# Patient Record
Sex: Female | Born: 1995 | Hispanic: Yes | Smoking: Never smoker
Health system: Southern US, Community
[De-identification: ages and names within clinical notes are randomized; demographics above are authoritative.]

## PROBLEM LIST (undated history)

## (undated) DIAGNOSIS — R112 Nausea with vomiting, unspecified: Secondary | ICD-10-CM

## (undated) DIAGNOSIS — O139 Gestational [pregnancy-induced] hypertension without significant proteinuria, unspecified trimester: Secondary | ICD-10-CM

## (undated) DIAGNOSIS — Z9889 Other specified postprocedural states: Secondary | ICD-10-CM

## (undated) DIAGNOSIS — I10 Essential (primary) hypertension: Secondary | ICD-10-CM

## (undated) DIAGNOSIS — N12 Tubulo-interstitial nephritis, not specified as acute or chronic: Secondary | ICD-10-CM

## (undated) DIAGNOSIS — C801 Malignant (primary) neoplasm, unspecified: Secondary | ICD-10-CM

## (undated) DIAGNOSIS — N390 Urinary tract infection, site not specified: Secondary | ICD-10-CM

## (undated) HISTORY — PX: TUMOR REMOVAL: SHX12

---
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
## 2015-02-22 DIAGNOSIS — N12 Tubulo-interstitial nephritis, not specified as acute or chronic: Secondary | ICD-10-CM

## 2015-02-22 HISTORY — DX: Tubulo-interstitial nephritis, not specified as acute or chronic: N12

## 2015-03-09 ENCOUNTER — Encounter (HOSPITAL_COMMUNITY): Payer: Self-pay | Admitting: *Deleted

## 2015-03-09 ENCOUNTER — Emergency Department (HOSPITAL_COMMUNITY)
Admission: EM | Admit: 2015-03-09 | Discharge: 2015-03-09 | Disposition: A | Discharge status: Home / Self Care | Payer: Self-pay | Attending: Emergency Medicine | Admitting: Emergency Medicine

## 2015-03-09 ENCOUNTER — Emergency Department (HOSPITAL_COMMUNITY): Payer: Self-pay

## 2015-03-09 DIAGNOSIS — Z3202 Encounter for pregnancy test, result negative: Secondary | ICD-10-CM | POA: Insufficient documentation

## 2015-03-09 DIAGNOSIS — R05 Cough: Secondary | ICD-10-CM | POA: Insufficient documentation

## 2015-03-09 DIAGNOSIS — N390 Urinary tract infection, site not specified: Secondary | ICD-10-CM | POA: Insufficient documentation

## 2015-03-09 LAB — URINALYSIS, ROUTINE W REFLEX MICROSCOPIC
Bilirubin Urine: NEGATIVE
Glucose, UA: NEGATIVE mg/dL
Ketones, ur: 15 mg/dL — AB
Nitrite: POSITIVE — AB
Protein, ur: 30 mg/dL — AB
Specific Gravity, Urine: 1.016 (ref 1.005–1.030)
Urobilinogen, UA: 0.2 mg/dL (ref 0.0–1.0)
pH: 6.5 (ref 5.0–8.0)

## 2015-03-09 LAB — CBC
HCT: 33.9 % — ABNORMAL LOW (ref 36.0–46.0)
Hemoglobin: 10.8 g/dL — ABNORMAL LOW (ref 12.0–15.0)
MCH: 23.4 pg — ABNORMAL LOW (ref 26.0–34.0)
MCHC: 31.9 g/dL (ref 30.0–36.0)
MCV: 73.4 fL — ABNORMAL LOW (ref 78.0–100.0)
Platelets: 226 10*3/uL (ref 150–400)
RBC: 4.62 MIL/uL (ref 3.87–5.11)
RDW: 16.6 % — ABNORMAL HIGH (ref 11.5–15.5)
WBC: 15.5 10*3/uL — ABNORMAL HIGH (ref 4.0–10.5)

## 2015-03-09 LAB — I-STAT CG4 LACTIC ACID, ED: Lactic Acid, Venous: 2.49 mmol/L (ref 0.5–2.0)

## 2015-03-09 LAB — COMPREHENSIVE METABOLIC PANEL
ALT: 18 U/L (ref 14–54)
AST: 28 U/L (ref 15–41)
Albumin: 3.4 g/dL — ABNORMAL LOW (ref 3.5–5.0)
Alkaline Phosphatase: 53 U/L (ref 38–126)
Anion gap: 12 (ref 5–15)
BUN: 6 mg/dL (ref 6–20)
CO2: 21 mmol/L — ABNORMAL LOW (ref 22–32)
Calcium: 8.7 mg/dL — ABNORMAL LOW (ref 8.9–10.3)
Chloride: 99 mmol/L — ABNORMAL LOW (ref 101–111)
Creatinine, Ser: 0.85 mg/dL (ref 0.44–1.00)
GFR calc Af Amer: >60 mL/min (ref >60)
GFR calc non Af Amer: >60 mL/min (ref >60)
Glucose, Bld: 184 mg/dL — ABNORMAL HIGH (ref 65–99)
Potassium: 2.9 mmol/L — ABNORMAL LOW (ref 3.5–5.1)
Sodium: 132 mmol/L — ABNORMAL LOW (ref 135–145)
Total Bilirubin: 0.4 mg/dL (ref 0.3–1.2)
Total Protein: 7.3 g/dL (ref 6.5–8.1)

## 2015-03-09 LAB — URINE MICROSCOPIC-ADD ON

## 2015-03-09 LAB — I-STAT BETA HCG BLOOD, ED (MC, WL, AP ONLY): I-stat hCG, quantitative: <5 m[IU]/mL (ref <5)

## 2015-03-09 MED ORDER — POTASSIUM CHLORIDE CRYS ER 20 MEQ PO TBCR
40.0000 meq | EXTENDED_RELEASE_TABLET | Freq: Once | ORAL | Status: AC
  Administered 2015-03-09: 40 meq via ORAL
  Filled 2015-03-09: qty 2

## 2015-03-09 MED ORDER — ACETAMINOPHEN 325 MG PO TABS: 650.0000 mg | ORAL_TABLET | Freq: Once | ORAL | Status: DC | PRN

## 2015-03-09 MED ORDER — IBUPROFEN 400 MG PO TABS
800.0000 mg | ORAL_TABLET | Freq: Once | ORAL | Status: AC
  Administered 2015-03-09: 800 mg via ORAL

## 2015-03-09 MED ORDER — CEPHALEXIN 500 MG PO CAPS: 500.0000 mg | ORAL_CAPSULE | Freq: Four times a day (QID) | ORAL | Status: DC

## 2015-03-09 MED ORDER — DEXTROSE 5 % IV SOLN
1.0000 g | Freq: Once | INTRAVENOUS | Status: AC
  Administered 2015-03-09: 1 g via INTRAVENOUS
  Filled 2015-03-09: qty 10

## 2015-03-09 MED ORDER — PROMETHAZINE HCL 25 MG PO TABS: 25.0000 mg | ORAL_TABLET | Freq: Four times a day (QID) | ORAL | Status: DC | PRN

## 2015-03-09 MED ORDER — ACETAMINOPHEN 325 MG PO TABS
650.0000 mg | ORAL_TABLET | Freq: Once | ORAL | Status: AC
  Administered 2015-03-09: 650 mg via ORAL
  Filled 2015-03-09: qty 2

## 2015-03-09 MED ORDER — SODIUM CHLORIDE 0.9 % IV BOLUS (SEPSIS)
1000.0000 mL | Freq: Once | INTRAVENOUS | Status: AC
Start: 2015-03-09 — End: 2015-03-09
  Administered 2015-03-09: 1000 mL via INTRAVENOUS

## 2015-03-09 MED ORDER — SODIUM CHLORIDE 0.9 % IV BOLUS (SEPSIS)
1000.0000 mL | Freq: Once | INTRAVENOUS | Status: AC
  Administered 2015-03-09: 1000 mL via INTRAVENOUS

## 2015-03-09 MED ORDER — IBUPROFEN 400 MG PO TABS
ORAL_TABLET | ORAL | Status: AC
  Administered 2015-03-09: 13:00:00
  Filled 2015-03-09: qty 2

## 2015-03-09 NOTE — Discharge Instructions (Signed)
[facility]  Return sooner if problems

## 2015-03-09 NOTE — ED Provider Notes (Signed)
CSN: [id number]     Arrival date & time 03/09/15  1233 History   First MD Initiated Contact with Patient 03/09/15 1259     Chief Complaint  Patient presents with  . Cough  . Fever  . Emesis     (Consider location/radiation/quality/duration/timing/severity/associated sxs/prior Treatment) Patient is a 19 y.o. female presenting with cough, fever, and vomiting. The history is provided by the patient (Patient complains of a cough fevers and chills some vomiting for couple days).  Cough Cough characteristics:  Non-productive Severity:  Mild Onset quality:  Gradual Timing:  Intermittent Chronicity:  New Smoker: no   Context: not animal exposure   Associated symptoms: fever   Associated symptoms: no chest pain, no eye discharge, no headaches and no rash   Fever Associated symptoms: cough and vomiting   Associated symptoms: no chest pain, no congestion, no diarrhea, no headaches and no rash   Emesis Associated symptoms: no abdominal pain, no diarrhea and no headaches     History reviewed. No pertinent past medical history. History reviewed. No pertinent past surgical history. History reviewed. No pertinent family history. Social History  Substance Use Topics  . Smoking status: Never Smoker   . Smokeless tobacco: None  . Alcohol Use: No   OB History    No data available     Review of Systems  Constitutional: Positive for fever. Negative for appetite change and fatigue.  HENT: Negative for congestion, ear discharge and sinus pressure.   Eyes: Negative for discharge.  Respiratory: Positive for cough.   Cardiovascular: Negative for chest pain.  Gastrointestinal: Positive for vomiting. Negative for abdominal pain and diarrhea.  Genitourinary: Negative for frequency and hematuria.  Musculoskeletal: Negative for back pain.  Skin: Negative for rash.  Neurological: Negative for seizures and headaches.  Psychiatric/Behavioral: Negative for hallucinations.      Allergies   Review of patient's allergies indicates no known allergies.  Home Medications   Prior to Admission medications   Medication Sig Start Date End Date Taking? Authorizing Provider  acetaminophen (TYLENOL) 500 MG tablet Take 500 mg by mouth every 6 (six) hours as needed for moderate pain.   Yes Historical Provider, MD  cephALEXin (KEFLEX) 500 MG capsule Take 1 capsule (500 mg total) by mouth 4 (four) times daily. 03/09/15   Milton Ferguson, MD  promethazine (PHENERGAN) 25 MG tablet Take 1 tablet (25 mg total) by mouth every 6 (six) hours as needed for nausea or vomiting. 03/09/15   Milton Ferguson, MD   BP 116/60 mmHg  Pulse 91  Temp(Src) 99.2 F (37.3 C) (Oral)  Resp 20  Ht 5\' 3"  (1.6 m)  Wt 129 lb 12.8 oz (58.877 kg)  BMI 23.00 kg/m2  SpO2 100%  LMP 02/17/2015 Physical Exam  Constitutional: She is oriented to person, place, and time. She appears well-developed.  HENT:  Head: Normocephalic.  Eyes: Conjunctivae and EOM are normal. No scleral icterus.  Neck: Neck supple. No thyromegaly present.  Cardiovascular: Normal rate and regular rhythm.  Exam reveals no gallop and no friction rub.   No murmur heard. Pulmonary/Chest: No stridor. She has no wheezes. She has no rales. She exhibits no tenderness.  Abdominal: She exhibits no distension. There is no tenderness. There is no rebound.  Musculoskeletal: Normal range of motion. She exhibits no edema.  Lymphadenopathy:    She has no cervical adenopathy.  Neurological: She is oriented to person, place, and time. She exhibits normal muscle tone. Coordination normal.  Skin: No rash noted. No erythema.  Psychiatric: She has a normal mood and affect. Her behavior is normal.    ED Course  Procedures (including critical care time) Labs Review Labs Reviewed  COMPREHENSIVE METABOLIC PANEL - Abnormal; Notable for the following:    Sodium 132 (*)    Potassium 2.9 (*)    Chloride 99 (*)    CO2 21 (*)    Glucose, Bld 184 (*)    Calcium 8.7 (*)     Albumin 3.4 (*)    All other components within normal limits  URINALYSIS, ROUTINE W REFLEX MICROSCOPIC (NOT AT [facility]) - Abnormal; Notable for the following:    APPearance CLOUDY (*)    Hgb urine dipstick TRACE (*)    Ketones, ur 15 (*)    Protein, ur 30 (*)    Nitrite POSITIVE (*)    Leukocytes, UA MODERATE (*)    All other components within normal limits  CBC - Abnormal; Notable for the following:    WBC 15.5 (*)    Hemoglobin 10.8 (*)    HCT 33.9 (*)    MCV 73.4 (*)    MCH 23.4 (*)    RDW 16.6 (*)    All other components within normal limits  URINE MICROSCOPIC-ADD ON - Abnormal; Notable for the following:    Bacteria, UA MANY (*)    All other components within normal limits  I-STAT CG4 LACTIC ACID, ED - Abnormal; Notable for the following:    Lactic Acid, Venous 2.49 (*)    All other components within normal limits  URINE CULTURE  I-STAT BETA HCG BLOOD, ED ([facility], [facility], [facility] ONLY)  I-STAT CG4 LACTIC ACID, ED    Imaging Review Dg Chest 2 View  03/09/2015  CLINICAL DATA:  Productive cough and fever for 1 week. EXAM: CHEST  2 VIEW COMPARISON:  None. FINDINGS: The lungs are clear. Heart size is normal. No pneumothorax or pleural effusion. No focal bony abnormality. IMPRESSION: Negative chest. Electronically Signed   By: Inge Rise M.D.   On: 03/09/2015 14:08   I have personally reviewed and evaluated these images and lab results as part of my medical decision-making.   EKG Interpretation None      MDM   Final diagnoses:  UTI (lower urinary tract infection)      Patient's labs showed UTI and hypokalemia. Patient was hydrated with a liter of fluid here in emergency department and given a gram Rocephin IV. She appeared nontoxic discharge and will be given prescription for Keflex and Zofran and referred to [facility]  Milton Ferguson, MD 03/09/15 1559

## 2015-03-09 NOTE — ED Notes (Signed)
Pt reports recent cough and cold symptoms, having productive cough with green sputum and reports fever x 1 week. Today began vomiting and feeling sob. HR 144 at triage.

## 2015-03-10 ENCOUNTER — Inpatient Hospital Stay (HOSPITAL_COMMUNITY)
Admission: EM | Admit: 2015-03-10 | Discharge: 2015-03-12 | DRG: 872 | Disposition: A | Discharge status: Home / Self Care | Payer: Self-pay | Attending: Internal Medicine | Admitting: Internal Medicine

## 2015-03-10 ENCOUNTER — Encounter (HOSPITAL_COMMUNITY): Payer: Self-pay | Admitting: Emergency Medicine

## 2015-03-10 ENCOUNTER — Emergency Department (HOSPITAL_COMMUNITY): Payer: Self-pay

## 2015-03-10 DIAGNOSIS — N12 Tubulo-interstitial nephritis, not specified as acute or chronic: Secondary | ICD-10-CM | POA: Diagnosis present

## 2015-03-10 DIAGNOSIS — N390 Urinary tract infection, site not specified: Secondary | ICD-10-CM

## 2015-03-10 DIAGNOSIS — A4151 Sepsis due to Escherichia coli [E. coli]: Principal | ICD-10-CM | POA: Diagnosis present

## 2015-03-10 DIAGNOSIS — A419 Sepsis, unspecified organism: Secondary | ICD-10-CM | POA: Diagnosis present

## 2015-03-10 DIAGNOSIS — B9620 Unspecified Escherichia coli [E. coli] as the cause of diseases classified elsewhere: Secondary | ICD-10-CM | POA: Diagnosis present

## 2015-03-10 HISTORY — DX: Tubulo-interstitial nephritis, not specified as acute or chronic: N12

## 2015-03-10 HISTORY — DX: Urinary tract infection, site not specified: N39.0

## 2015-03-10 LAB — CBC WITH DIFFERENTIAL/PLATELET
Basophils Absolute: 0 10*3/uL (ref 0.0–0.1)
Basophils Relative: 0 %
Eosinophils Absolute: 0 10*3/uL (ref 0.0–0.7)
Eosinophils Relative: 0 %
HCT: 33.2 % — ABNORMAL LOW (ref 36.0–46.0)
Hemoglobin: 10.7 g/dL — ABNORMAL LOW (ref 12.0–15.0)
Lymphocytes Relative: 10 %
Lymphs Abs: 1.7 10*3/uL (ref 0.7–4.0)
MCH: 23.6 pg — ABNORMAL LOW (ref 26.0–34.0)
MCHC: 32.2 g/dL (ref 30.0–36.0)
MCV: 73.3 fL — ABNORMAL LOW (ref 78.0–100.0)
Monocytes Absolute: 1.9 10*3/uL — ABNORMAL HIGH (ref 0.1–1.0)
Monocytes Relative: 10 %
Neutro Abs: 14.5 10*3/uL — ABNORMAL HIGH (ref 1.7–7.7)
Neutrophils Relative %: 80 %
Platelets: 244 10*3/uL (ref 150–400)
RBC: 4.53 MIL/uL (ref 3.87–5.11)
RDW: 16.6 % — ABNORMAL HIGH (ref 11.5–15.5)
WBC: 18.2 10*3/uL — ABNORMAL HIGH (ref 4.0–10.5)

## 2015-03-10 LAB — I-STAT CG4 LACTIC ACID, ED: Lactic Acid, Venous: 2.39 mmol/L (ref 0.5–2.0)

## 2015-03-10 MED ORDER — ACETAMINOPHEN 325 MG PO TABS
ORAL_TABLET | ORAL | Status: AC
  Filled 2015-03-10: qty 2

## 2015-03-10 MED ORDER — ACETAMINOPHEN 325 MG PO TABS
650.0000 mg | ORAL_TABLET | Freq: Once | ORAL | Status: AC | PRN
  Administered 2015-03-10: 650 mg via ORAL

## 2015-03-10 NOTE — ED Notes (Signed)
Pt. reports left flank pain with fever and chills onset last week , sen here yesterday diagnosed with UTI .

## 2015-03-11 ENCOUNTER — Emergency Department (HOSPITAL_COMMUNITY): Payer: Self-pay

## 2015-03-11 ENCOUNTER — Encounter (HOSPITAL_COMMUNITY): Payer: Self-pay | Admitting: General Practice

## 2015-03-11 DIAGNOSIS — B9620 Unspecified Escherichia coli [E. coli] as the cause of diseases classified elsewhere: Secondary | ICD-10-CM

## 2015-03-11 DIAGNOSIS — A419 Sepsis, unspecified organism: Secondary | ICD-10-CM

## 2015-03-11 DIAGNOSIS — N390 Urinary tract infection, site not specified: Secondary | ICD-10-CM

## 2015-03-11 DIAGNOSIS — N12 Tubulo-interstitial nephritis, not specified as acute or chronic: Secondary | ICD-10-CM

## 2015-03-11 LAB — COMPREHENSIVE METABOLIC PANEL WITH GFR
ALT: 20 U/L (ref 14–54)
AST: 29 U/L (ref 15–41)
Albumin: 3.1 g/dL — ABNORMAL LOW (ref 3.5–5.0)
Alkaline Phosphatase: 60 U/L (ref 38–126)
Anion gap: 10 (ref 5–15)
BUN: <5 mg/dL — ABNORMAL LOW (ref 6–20)
CO2: 21 mmol/L — ABNORMAL LOW (ref 22–32)
Calcium: 8.4 mg/dL — ABNORMAL LOW (ref 8.9–10.3)
Chloride: 100 mmol/L — ABNORMAL LOW (ref 101–111)
Creatinine, Ser: 0.78 mg/dL (ref 0.44–1.00)
GFR calc Af Amer: >60 mL/min
GFR calc non Af Amer: >60 mL/min
Glucose, Bld: 137 mg/dL — ABNORMAL HIGH (ref 65–99)
Potassium: 3 mmol/L — ABNORMAL LOW (ref 3.5–5.1)
Sodium: 131 mmol/L — ABNORMAL LOW (ref 135–145)
Total Bilirubin: 0.4 mg/dL (ref 0.3–1.2)
Total Protein: 7.5 g/dL (ref 6.5–8.1)

## 2015-03-11 LAB — URINE CULTURE
Culture: >=100000
Special Requests: NORMAL

## 2015-03-11 LAB — URINALYSIS, ROUTINE W REFLEX MICROSCOPIC
Bilirubin Urine: NEGATIVE
Glucose, UA: NEGATIVE mg/dL
Hgb urine dipstick: NEGATIVE
Ketones, ur: NEGATIVE mg/dL
Leukocytes, UA: NEGATIVE
Nitrite: NEGATIVE
Protein, ur: 30 mg/dL — AB
Specific Gravity, Urine: 1.015 (ref 1.005–1.030)
Urobilinogen, UA: 1 mg/dL (ref 0.0–1.0)
pH: 6 (ref 5.0–8.0)

## 2015-03-11 LAB — POC URINE PREG, ED: Preg Test, Ur: NEGATIVE

## 2015-03-11 LAB — URINE MICROSCOPIC-ADD ON

## 2015-03-11 LAB — I-STAT CG4 LACTIC ACID, ED: Lactic Acid, Venous: 0.96 mmol/L (ref 0.5–2.0)

## 2015-03-11 MED ORDER — HYDROCODONE-ACETAMINOPHEN 5-325 MG PO TABS
1.0000 | ORAL_TABLET | ORAL | Status: DC | PRN
  Administered 2015-03-11 – 2015-03-12 (×4): 1 via ORAL
  Filled 2015-03-11 (×4): qty 1

## 2015-03-11 MED ORDER — IOHEXOL 300 MG/ML  SOLN
100.0000 mL | Freq: Once | INTRAMUSCULAR | Status: AC | PRN
  Administered 2015-03-11: 100 mL via INTRAVENOUS

## 2015-03-11 MED ORDER — ACETAMINOPHEN 325 MG PO TABS: 650.0000 mg | ORAL_TABLET | Freq: Four times a day (QID) | ORAL | Status: DC | PRN

## 2015-03-11 MED ORDER — CEFTRIAXONE SODIUM 2 G IJ SOLR
2.0000 g | INTRAMUSCULAR | Status: DC
  Administered 2015-03-12: 2 g via INTRAVENOUS
  Filled 2015-03-11 (×2): qty 2

## 2015-03-11 MED ORDER — INFLUENZA VAC SPLIT QUAD 0.5 ML IM SUSY
0.5000 mL | PREFILLED_SYRINGE | INTRAMUSCULAR | Status: AC
Start: 2015-03-12 — End: 2015-03-12
  Administered 2015-03-12: 0.5 mL via INTRAMUSCULAR
  Filled 2015-03-11: qty 0.5

## 2015-03-11 MED ORDER — MORPHINE SULFATE (PF) 4 MG/ML IV SOLN
4.0000 mg | Freq: Once | INTRAVENOUS | Status: AC
  Administered 2015-03-11: 4 mg via INTRAVENOUS
  Filled 2015-03-11: qty 1

## 2015-03-11 MED ORDER — DEXTROSE 5 % IV SOLN
2.0000 g | Freq: Once | INTRAVENOUS | Status: AC
  Administered 2015-03-11: 2 g via INTRAVENOUS
  Filled 2015-03-11: qty 2

## 2015-03-11 MED ORDER — SODIUM CHLORIDE 0.9 % IV BOLUS (SEPSIS)
2000.0000 mL | Freq: Once | INTRAVENOUS | Status: AC
  Administered 2015-03-11: 2000 mL via INTRAVENOUS

## 2015-03-11 MED ORDER — ONDANSETRON HCL 4 MG/2ML IJ SOLN
4.0000 mg | Freq: Once | INTRAMUSCULAR | Status: AC
  Administered 2015-03-11: 4 mg via INTRAVENOUS
  Filled 2015-03-11: qty 2

## 2015-03-11 MED ORDER — ENOXAPARIN SODIUM 40 MG/0.4ML ~~LOC~~ SOLN
40.0000 mg | Freq: Every day | SUBCUTANEOUS | Status: DC
  Administered 2015-03-11: 40 mg via SUBCUTANEOUS
  Filled 2015-03-11 (×3): qty 0.4

## 2015-03-11 MED ORDER — SODIUM CHLORIDE 0.9 % IV SOLN
INTRAVENOUS | Status: DC
  Administered 2015-03-11 – 2015-03-12 (×3): via INTRAVENOUS

## 2015-03-11 MED ORDER — DEXTROSE 5 % IV SOLN: 1.0000 g | INTRAVENOUS | Status: DC

## 2015-03-11 NOTE — ED Provider Notes (Signed)
CSN: [id number]     Arrival date & time 03/10/15  2246 History   First MD Initiated Contact with Patient 03/10/15 2344     Chief Complaint  Patient presents with  . Flank Pain  . Fever     (Consider location/radiation/quality/duration/timing/severity/associated sxs/prior Treatment) HPI  This is a 19 year old female who presents with persistent left flank pain and fever. Patient was seen and evaluated yesterday. She was found to have a urinary tract infection. She was given IV Rocephin and discharged home with antibiotics. She reports that she has been taking her antibiotics. She reports persistent left flank pain and continued fevers.  Currently her pain is 10 out of 10. She's not taken anything for the pain. Fevers to 103 at home.  Patient also reports nausea and vomiting. Denies any abdominal pain.  Urine culture from yesterday growing out greater than 100,000 Escherichia coli.  Past Medical History  Diagnosis Date  . UTI (lower urinary tract infection)    History reviewed. No pertinent past surgical history. No family history on file. Social History  Substance Use Topics  . Smoking status: Never Smoker   . Smokeless tobacco: None  . Alcohol Use: No   OB History    No data available     Review of Systems  Constitutional: Positive for fever.  Respiratory: Negative for chest tightness and shortness of breath.   Cardiovascular: Negative for chest pain.  Gastrointestinal: Positive for nausea and vomiting. Negative for abdominal pain.  Genitourinary: Positive for dysuria and flank pain.  Musculoskeletal: Negative for back pain.  Skin: Negative for wound.  Neurological: Negative for headaches.  Psychiatric/Behavioral: Negative for confusion.  All other systems reviewed and are negative.     Allergies  Review of patient's allergies indicates no known allergies.  Home Medications   Prior to Admission medications   Medication Sig Start Date End Date Taking? Authorizing  Provider  acetaminophen (TYLENOL) 500 MG tablet Take 500 mg by mouth every 6 (six) hours as needed for moderate pain.   Yes Historical Provider, MD  cephALEXin (KEFLEX) 500 MG capsule Take 1 capsule (500 mg total) by mouth 4 (four) times daily. 03/09/15  Yes Milton Ferguson, MD  promethazine (PHENERGAN) 25 MG tablet Take 1 tablet (25 mg total) by mouth every 6 (six) hours as needed for nausea or vomiting. 03/09/15  Yes Milton Ferguson, MD   BP 109/65 mmHg  Pulse 80  Temp(Src) 103.2 F (39.6 C) (Oral)  Resp 16  Ht 5\' 3"  (1.6 m)  Wt 130 lb (58.968 kg)  BMI 23.03 kg/m2  SpO2 99%  LMP 02/17/2015 Physical Exam  Constitutional: She is oriented to person, place, and time. She appears well-developed and well-nourished. No distress.  HENT:  Head: Normocephalic and atraumatic.  Cardiovascular: Regular rhythm and normal heart sounds.   Tachycardia  Pulmonary/Chest: Effort normal and breath sounds normal. No respiratory distress. She has no wheezes.  Abdominal: Soft. Bowel sounds are normal. There is no tenderness. There is no rebound.  Genitourinary:  Left CVA tenderness  Neurological: She is alert and oriented to person, place, and time.  Skin: Skin is warm and dry.  Psychiatric: She has a normal mood and affect.  Nursing note and vitals reviewed.   ED Course  Procedures (including critical care time) Labs Review Labs Reviewed  COMPREHENSIVE METABOLIC PANEL - Abnormal; Notable for the following:    Sodium 131 (*)    Potassium 3.0 (*)    Chloride 100 (*)    CO2 21 (*)  Glucose, Bld 137 (*)    BUN <5 (*)    Calcium 8.4 (*)    Albumin 3.1 (*)    All other components within normal limits  URINALYSIS, ROUTINE W REFLEX MICROSCOPIC (NOT AT [facility]) - Abnormal; Notable for the following:    Protein, ur 30 (*)    All other components within normal limits  CBC WITH DIFFERENTIAL/PLATELET - Abnormal; Notable for the following:    WBC 18.2 (*)    Hemoglobin 10.7 (*)    HCT 33.2 (*)    MCV  73.3 (*)    MCH 23.6 (*)    RDW 16.6 (*)    Neutro Abs 14.5 (*)    Monocytes Absolute 1.9 (*)    All other components within normal limits  URINE MICROSCOPIC-ADD ON - Abnormal; Notable for the following:    Squamous Epithelial / LPF FEW (*)    Bacteria, UA FEW (*)    All other components within normal limits  I-STAT CG4 LACTIC ACID, ED - Abnormal; Notable for the following:    Lactic Acid, Venous 2.39 (*)    All other components within normal limits  CULTURE, BLOOD (ROUTINE X 2)  CULTURE, BLOOD (ROUTINE X 2)  URINE CULTURE  POC URINE PREG, ED  I-STAT CG4 LACTIC ACID, ED    Imaging Review Dg Chest 2 View  03/10/2015  CLINICAL DATA:  Cough and fever for 1 week EXAM: CHEST - 2 VIEW COMPARISON:  03/09/1959 FINDINGS: The heart size and mediastinal contours are within normal limits. Both lungs are clear. The visualized skeletal structures are unremarkable. IMPRESSION: No active disease. Electronically Signed   By: Inez Catalina M.D.   On: 03/10/2015 23:38   Dg Chest 2 View  03/09/2015  CLINICAL DATA:  Productive cough and fever for 1 week. EXAM: CHEST  2 VIEW COMPARISON:  None. FINDINGS: The lungs are clear. Heart size is normal. No pneumothorax or pleural effusion. No focal bony abnormality. IMPRESSION: Negative chest. Electronically Signed   By: Inge Rise M.D.   On: 03/09/2015 14:08   Ct Abdomen Pelvis W Contrast  03/11/2015  CLINICAL DATA:  Acute onset of left flank pain, with fever and chills. Initial encounter. EXAM: CT ABDOMEN AND PELVIS WITH CONTRAST TECHNIQUE: Multidetector CT imaging of the abdomen and pelvis was performed using the standard protocol following bolus administration of intravenous contrast. CONTRAST:  143mL OMNIPAQUE IOHEXOL 300 MG/ML  SOLN COMPARISON:  CT of the abdomen and pelvis from 10/06/2010 FINDINGS: Minimal bibasilar atelectasis is noted. The liver and spleen are unremarkable in appearance. The gallbladder is within normal limits. The pancreas and  adrenal glands are unremarkable. Multiple areas of decreased enhancement are noted in the periphery of both kidneys, particularly on the left, with mild overlying soft tissue stranding, compatible with bilateral pyelonephritis. There is no evidence of hydronephrosis. No renal or ureteral stones are seen. No free fluid is identified. The small bowel is unremarkable in appearance. The stomach is within normal limits. No acute vascular abnormalities are seen. The appendix is normal in caliber and contains air, without evidence of appendicitis. The colon is unremarkable in appearance. The bladder is mildly distended and grossly unremarkable. The uterus is within normal limits. The right ovary is unremarkable in appearance. The left ovary is not well characterized. No suspicious adnexal masses are seen. No inguinal lymphadenopathy is seen. No acute osseous abnormalities are identified. IMPRESSION: Multiple areas of decreased enhancement in the periphery of both kidneys, particularly on the left, with mild overlying soft  tissue stranding, compatible with bilateral pyelonephritis. Electronically Signed   By: Garald Balding M.D.   On: 03/11/2015 01:55   I have personally reviewed and evaluated these images and lab results as part of my medical decision-making.   EKG Interpretation None      MDM   Final diagnoses:  Pyelonephritis    Patient presents with persistent left flank pain and fever. Was diagnosed with urinary tract infection. Discharged with antibiotics. Continues to have pain and fever. Tachycardic and febrile on initial evaluation. CVA tenderness on the left. Patient given fluids and Zofran. Patient given 2 g of IV Rocephin. She was also given morphine. CT obtained to rule out perinephric abscess. CT negative for perinephric abscess; however, patient does have some evidence of bilateral pyelonephritis. On repeat evaluation, patient reporting persistent pain. Vital signs are stabilized. Discussed  with patient admission for further pain management and hydration as well as antibiotics. Feel this is reasonable given that this is the patient's visit and she remains febrile with evidence of possible bilateral pyelonephritis.      Merryl Hacker, MD 03/11/15 [phone]

## 2015-03-11 NOTE — Progress Notes (Signed)
Shannon Lyons [id number] Admission Data: 03/11/2015 10:43 AM Attending Provider: Mendel Corning, MD  PCP:No primary care provider on file. Consults/ Treatment Team:    Shannon Lyons is a 19 y.o. female patient admitted from ED awake, alert  & orientated  X 3,  Full Code, VSS - Blood pressure 117/68, pulse 101, temperature 102.3 F (39.1 C), temperature source Oral, resp. rate 16, height 5\' 3"  (1.6 m), weight 58.968 kg (130 lb), last menstrual period 02/17/2015, SpO2 100 %., RA, no c/o shortness of breath, no c/o chest pain, no distress noted.    IV site WDL:  hand left, condition patent and no redness with a transparent dsg that's clean dry and intact.  Allergies:  No Known Allergies   Past Medical History  Diagnosis Date  . UTI (lower urinary tract infection)     History:  obtained from chart review. Tobacco/alcohol: denied none  Pt orientation to unit, room and routine. Information packet given to patient/family and safety video watched.  Admission INP armband ID verified with patient/family, and in place. SR up x 2, fall risk assessment complete with Patient and family verbalizing understanding of risks associated with falls. Pt verbalizes an understanding of how to use the call bell and to call for help before getting out of bed.  Skin, clean-dry- intact without evidence of bruising, or skin tears.   No evidence of skin break down noted on exam, color normal. Assessed for pain and was given pain medication for pain and fever    Will cont to monitor and assist as needed.  [name], RN 03/11/2015 10:43 AM

## 2015-03-11 NOTE — Progress Notes (Signed)
[facility]                                                                              Patient Demographics  Shannon Lyons, is a 19 y.o. female, DOB - [birth year], GMW:[id number]  Admit date - 03/10/2015   Admitting Physician Etta Quill, DO  Outpatient Primary MD for the patient is No primary care provider on file.  LOS - 0   Chief Complaint  Patient presents with  . Flank Pain  . Fever       Brief HPI   Shannon Lyons is a 19 y.o. female presents to the ED with persistent L flank pain and fever. Patient was seen in ED yesterday, found to have UTI, given IV rocephin and discharged home with ABx. Despite taking ABx at home her symptoms have persisted and she returns to the ED today with 10/10 flank pain. Fevers 103 at home (103.2 in ED). Also has N/V.   Assessment & Plan    Principal Problem:   Pyelonephritis secondary to Escherichia coli UTI - CT abdomen showed multiple areas of decreased enhancement in the periphery of both kidneys particularly on the left compatible with bilateral pyelonephritis - Follow blood cultures, urine culture and sensitivities. Urine culture from 10/16 showed Escherichia coli - Continue IV Rocephin - Once pain is controlled and blood cultures remain negative, will DC home on oral antibiotics for 2 weeks  Active Problems:   Sepsis secondary to UTI ([place]) - Follow blood cultures   Code Status: Full CODE STATUS  Family Communication: Discussed in detail with the patient, all imaging results, lab results explained to the patient and family member in the room   Disposition Plan:   Time Spent in minutes  20 minutes  Procedures  CT abd  Consults   None   DVT Prophylaxis   Lovenox   Medications  Scheduled Meds: . cefTRIAXone (ROCEPHIN)  IV  2 g Intravenous Q24H  . enoxaparin (LOVENOX) injection  40 mg Subcutaneous Daily  . [START ON 03/12/2015] Influenza vac split quadrivalent PF  0.5 mL Intramuscular  Tomorrow-1000   Continuous Infusions: . sodium chloride Stopped (03/11/15 0822)   PRN Meds:.acetaminophen, HYDROcodone-acetaminophen   Antibiotics   Anti-infectives    Start     Dose/Rate Route Frequency Ordered Stop   03/12/15 0015  cefTRIAXone (ROCEPHIN) 1 g in dextrose 5 % 50 mL IVPB  Status:  Discontinued     1 g 100 mL/hr over 30 Minutes Intravenous Every 24 hours 03/11/15 0346 03/11/15 1341   03/11/15 1345  cefTRIAXone (ROCEPHIN) 2 g in dextrose 5 % 50 mL IVPB     2 g 100 mL/hr over 30 Minutes Intravenous Every 24 hours 03/11/15 1341     03/11/15 0015  cefTRIAXone (ROCEPHIN) 2 g in dextrose 5 % 50 mL IVPB     2 g 100 mL/hr over 30 Minutes Intravenous  Once 03/11/15 0012 03/11/15 0104        Subjective:   Shannon Lyons was seen and examined today.  Complaining of flank pains, worse in the left. Patient denies dizziness, chest pain, shortness of breath, new weakness, numbess, tingling.  No acute events overnight.    Objective:   Blood pressure 117/68, pulse 101, temperature 99.3 F (37.4 C), temperature source Oral, resp. rate 16, height 5\' 3"  (1.6 m), weight 58.968 kg (130 lb), last menstrual period 02/17/2015, SpO2 100 %.  Wt Readings from Last 3 Encounters:  03/10/15 58.968 kg (130 lb)  03/09/15 58.877 kg (129 lb 12.8 oz)    No intake or output data in the 24 hours ending 03/11/15 1354  Exam  General: Alert and oriented x 3, NAD  HEENT:  PERRLA, EOMI, Anicteric Sclera, mucous membranes moist.   Neck: Supple, no JVD, no masses  CVS: S1 S2 auscultated, no rubs, murmurs or gallops. Regular rate and rhythm.  Respiratory: Clear to auscultation bilaterally, no wheezing, rales or rhonchi  Abdomen: Soft, nontender, nondistended, + bowel sounds. + CVAT left  Ext: no cyanosis clubbing or edema  Neuro: AAOx3, Cr N's II- XII. Strength 5/5 upper and lower extremities bilaterally  Skin: No rashes  Psych: Normal affect and demeanor, alert and oriented x3     Data Review   Micro Results Recent Results (from the past 240 hour(s))  Urine culture     Status: None   Collection Time: 03/09/15  1:39 PM  Result Value Ref Range Status   Specimen Description URINE, RANDOM  Final   Special Requests Normal  Final   Culture >=100,000 COLONIES/mL ESCHERICHIA COLI  Final   Report Status 03/11/2015 FINAL  Final   Organism ID, Bacteria ESCHERICHIA COLI  Final      Susceptibility   Escherichia coli - MIC*    AMPICILLIN >=32 RESISTANT Resistant     CEFAZOLIN <=4 SENSITIVE Sensitive     CEFTRIAXONE <=1 SENSITIVE Sensitive     CIPROFLOXACIN <=0.25 SENSITIVE Sensitive     GENTAMICIN <=1 SENSITIVE Sensitive     IMIPENEM <=0.25 SENSITIVE Sensitive     NITROFURANTOIN <=16 SENSITIVE Sensitive     TRIMETH/SULFA >=320 RESISTANT Resistant     AMPICILLIN/SULBACTAM 16 INTERMEDIATE Intermediate     PIP/TAZO <=4 SENSITIVE Sensitive     * >=100,000 COLONIES/mL ESCHERICHIA COLI    Radiology Reports Dg Chest 2 View  03/10/2015  CLINICAL DATA:  Cough and fever for 1 week EXAM: CHEST - 2 VIEW COMPARISON:  03/09/1959 FINDINGS: The heart size and mediastinal contours are within normal limits. Both lungs are clear. The visualized skeletal structures are unremarkable. IMPRESSION: No active disease. Electronically Signed   By: Inez Catalina M.D.   On: 03/10/2015 23:38   Dg Chest 2 View  03/09/2015  CLINICAL DATA:  Productive cough and fever for 1 week. EXAM: CHEST  2 VIEW COMPARISON:  None. FINDINGS: The lungs are clear. Heart size is normal. No pneumothorax or pleural effusion. No focal bony abnormality. IMPRESSION: Negative chest. Electronically Signed   By: Inge Rise M.D.   On: 03/09/2015 14:08   Ct Abdomen Pelvis W Contrast  03/11/2015  CLINICAL DATA:  Acute onset of left flank pain, with fever and chills. Initial encounter. EXAM: CT ABDOMEN AND PELVIS WITH CONTRAST TECHNIQUE: Multidetector CT imaging of the abdomen and pelvis was performed using the standard  protocol following bolus administration of intravenous contrast. CONTRAST:  145mL OMNIPAQUE IOHEXOL 300 MG/ML  SOLN COMPARISON:  CT of the abdomen and pelvis from 10/06/2010 FINDINGS: Minimal bibasilar atelectasis is noted. The liver and spleen are unremarkable in appearance. The gallbladder is within normal limits. The pancreas and adrenal glands are unremarkable. Multiple areas of decreased enhancement are noted in the periphery of  both kidneys, particularly on the left, with mild overlying soft tissue stranding, compatible with bilateral pyelonephritis. There is no evidence of hydronephrosis. No renal or ureteral stones are seen. No free fluid is identified. The small bowel is unremarkable in appearance. The stomach is within normal limits. No acute vascular abnormalities are seen. The appendix is normal in caliber and contains air, without evidence of appendicitis. The colon is unremarkable in appearance. The bladder is mildly distended and grossly unremarkable. The uterus is within normal limits. The right ovary is unremarkable in appearance. The left ovary is not well characterized. No suspicious adnexal masses are seen. No inguinal lymphadenopathy is seen. No acute osseous abnormalities are identified. IMPRESSION: Multiple areas of decreased enhancement in the periphery of both kidneys, particularly on the left, with mild overlying soft tissue stranding, compatible with bilateral pyelonephritis. Electronically Signed   By: Garald Balding M.D.   On: 03/11/2015 01:55    CBC  Recent Labs Lab 03/09/15 1252 03/10/15 2339  WBC 15.5* 18.2*  HGB 10.8* 10.7*  HCT 33.9* 33.2*  PLT 226 244  MCV 73.4* 73.3*  MCH 23.4* 23.6*  MCHC 31.9 32.2  RDW 16.6* 16.6*  LYMPHSABS  --  1.7  MONOABS  --  1.9*  EOSABS  --  0.0  BASOSABS  --  0.0    Chemistries   Recent Labs Lab 03/09/15 1252 03/10/15 2339  NA 132* 131*  K 2.9* 3.0*  CL 99* 100*  CO2 21* 21*  GLUCOSE 184* 137*  BUN 6 <5*  CREATININE  0.85 0.78  CALCIUM 8.7* 8.4*  AST 28 29  ALT 18 20  ALKPHOS 53 60  BILITOT 0.4 0.4   ------------------------------------------------------------------------------------------------------------------ estimated creatinine clearance is 92.8 mL/min (by C-G formula based on Cr of 0.78). ------------------------------------------------------------------------------------------------------------------ No results for input(s): HGBA1C in the last 72 hours. ------------------------------------------------------------------------------------------------------------------ No results for input(s): CHOL, HDL, LDLCALC, TRIG, CHOLHDL, LDLDIRECT in the last 72 hours. ------------------------------------------------------------------------------------------------------------------ No results for input(s): TSH, T4TOTAL, T3FREE, THYROIDAB in the last 72 hours.  Invalid input(s): FREET3 ------------------------------------------------------------------------------------------------------------------ No results for input(s): VITAMINB12, FOLATE, FERRITIN, TIBC, IRON, RETICCTPCT in the last 72 hours.  Coagulation profile No results for input(s): INR, PROTIME in the last 168 hours.  No results for input(s): DDIMER in the last 72 hours.  Cardiac Enzymes No results for input(s): CKMB, TROPONINI, MYOGLOBIN in the last 168 hours.  Invalid input(s): CK ------------------------------------------------------------------------------------------------------------------ Invalid input(s): POCBNP  No results for input(s): GLUCAP in the last 72 hours.   Starling Jessie M.D. [facility] 03/11/2015, 1:54 PM  Pager: [phone] Between 7am to 7pm - call Pager - [phone]  After 7pm go to [email or web address] - password [email or web address]  Call night coverage person covering after 7pm

## 2015-03-11 NOTE — H&P (Signed)
[facility] History and Physical  Shannon Lyons WIO:[id number] DOB: 1996-04-21 DOA: 03/10/2015  Referring physician: EDP PCP: No primary care provider on file.   Chief Complaint: Flank pain, fever   HPI: Shannon Lyons is a 19 y.o. female presents to the ED with persistent L flank pain and fever.  Patient was seen in ED yesterday, found to have UTI, given IV rocephin and discharged home with ABx.  Despite taking ABx at home her symptoms have persisted and she returns to the ED today with 10/10 flank pain.  Fevers 103 at home (103.2 in ED).  Also has N/V.  Review of Systems: Systems reviewed.  As above, otherwise negative  Past Medical History  Diagnosis Date  . UTI (lower urinary tract infection)    History reviewed. No pertinent past surgical history. Social History:  reports that she has never smoked. She does not have any smokeless tobacco history on file. She reports that she does not drink alcohol or use illicit drugs.  No Known Allergies  No family history on file.   Prior to Admission medications   Medication Sig Start Date End Date Taking? Authorizing Provider  acetaminophen (TYLENOL) 500 MG tablet Take 500 mg by mouth every 6 (six) hours as needed for moderate pain.   Yes Historical Provider, MD  cephALEXin (KEFLEX) 500 MG capsule Take 1 capsule (500 mg total) by mouth 4 (four) times daily. 03/09/15  Yes Milton Ferguson, MD  promethazine (PHENERGAN) 25 MG tablet Take 1 tablet (25 mg total) by mouth every 6 (six) hours as needed for nausea or vomiting. 03/09/15  Yes Milton Ferguson, MD   Physical Exam: Filed Vitals:   03/11/15 0215  BP: 109/65  Pulse: 80  Temp:   Resp: 16    BP 109/65 mmHg  Pulse 80  Temp(Src) 103.2 F (39.6 C) (Oral)  Resp 16  Ht 5\' 3"  (1.6 m)  Wt 58.968 kg (130 lb)  BMI 23.03 kg/m2  SpO2 99%  LMP 02/17/2015  General Appearance:    Alert, oriented, no distress, appears stated age  Head:    Normocephalic, atraumatic  Eyes:     PERRL, EOMI, sclera non-icteric        Nose:   Nares without drainage or epistaxis. Mucosa, turbinates normal  Throat:   Moist mucous membranes. Oropharynx without erythema or exudate.  Neck:   Supple. No carotid bruits.  No thyromegaly.  No lymphadenopathy.   Back:     No CVA tenderness, no spinal tenderness  Lungs:     Clear to auscultation bilaterally, without wheezes, rhonchi or rales  Chest wall:    No tenderness to palpitation  Heart:    Regular rate and rhythm without murmurs, gallops, rubs  Abdomen:     Soft, non-tender, nondistended, normal bowel sounds, no organomegaly  Genitalia:    deferred  Rectal:    deferred  Extremities:   No clubbing, cyanosis or edema.  Pulses:   2+ and symmetric all extremities  Skin:   Skin color, texture, turgor normal, no rashes or lesions  Lymph nodes:   Cervical, supraclavicular, and axillary nodes normal  Neurologic:   CNII-XII intact. Normal strength, sensation and reflexes      throughout    Labs on Admission:  Basic Metabolic Panel:  Recent Labs Lab 03/09/15 1252 03/10/15 2339  NA 132* 131*  K 2.9* 3.0*  CL 99* 100*  CO2 21* 21*  GLUCOSE 184* 137*  BUN 6 <5*  CREATININE 0.85 0.78  CALCIUM 8.7* 8.4*  Liver Function Tests:  Recent Labs Lab 03/09/15 1252 03/10/15 2339  AST 28 29  ALT 18 20  ALKPHOS 53 60  BILITOT 0.4 0.4  PROT 7.3 7.5  ALBUMIN 3.4* 3.1*   No results for input(s): LIPASE, AMYLASE in the last 168 hours. No results for input(s): AMMONIA in the last 168 hours. CBC:  Recent Labs Lab 03/09/15 1252 03/10/15 2339  WBC 15.5* 18.2*  NEUTROABS  --  14.5*  HGB 10.8* 10.7*  HCT 33.9* 33.2*  MCV 73.4* 73.3*  PLT 226 244   Cardiac Enzymes: No results for input(s): CKTOTAL, CKMB, CKMBINDEX, TROPONINI in the last 168 hours.  BNP (last 3 results) No results for input(s): PROBNP in the last 8760 hours. CBG: No results for input(s): GLUCAP in the last 168 hours.  Radiological Exams on Admission: Dg Chest  2 View  03/10/2015  CLINICAL DATA:  Cough and fever for 1 week EXAM: CHEST - 2 VIEW COMPARISON:  03/09/1959 FINDINGS: The heart size and mediastinal contours are within normal limits. Both lungs are clear. The visualized skeletal structures are unremarkable. IMPRESSION: No active disease. Electronically Signed   By: Inez Catalina M.D.   On: 03/10/2015 23:38   Dg Chest 2 View  03/09/2015  CLINICAL DATA:  Productive cough and fever for 1 week. EXAM: CHEST  2 VIEW COMPARISON:  None. FINDINGS: The lungs are clear. Heart size is normal. No pneumothorax or pleural effusion. No focal bony abnormality. IMPRESSION: Negative chest. Electronically Signed   By: Inge Rise M.D.   On: 03/09/2015 14:08   Ct Abdomen Pelvis W Contrast  03/11/2015  CLINICAL DATA:  Acute onset of left flank pain, with fever and chills. Initial encounter. EXAM: CT ABDOMEN AND PELVIS WITH CONTRAST TECHNIQUE: Multidetector CT imaging of the abdomen and pelvis was performed using the standard protocol following bolus administration of intravenous contrast. CONTRAST:  144mL OMNIPAQUE IOHEXOL 300 MG/ML  SOLN COMPARISON:  CT of the abdomen and pelvis from 10/06/2010 FINDINGS: Minimal bibasilar atelectasis is noted. The liver and spleen are unremarkable in appearance. The gallbladder is within normal limits. The pancreas and adrenal glands are unremarkable. Multiple areas of decreased enhancement are noted in the periphery of both kidneys, particularly on the left, with mild overlying soft tissue stranding, compatible with bilateral pyelonephritis. There is no evidence of hydronephrosis. No renal or ureteral stones are seen. No free fluid is identified. The small bowel is unremarkable in appearance. The stomach is within normal limits. No acute vascular abnormalities are seen. The appendix is normal in caliber and contains air, without evidence of appendicitis. The colon is unremarkable in appearance. The bladder is mildly distended and grossly  unremarkable. The uterus is within normal limits. The right ovary is unremarkable in appearance. The left ovary is not well characterized. No suspicious adnexal masses are seen. No inguinal lymphadenopathy is seen. No acute osseous abnormalities are identified. IMPRESSION: Multiple areas of decreased enhancement in the periphery of both kidneys, particularly on the left, with mild overlying soft tissue stranding, compatible with bilateral pyelonephritis. Electronically Signed   By: Garald Balding M.D.   On: 03/11/2015 01:55    EKG: Independently reviewed.  Assessment/Plan Principal Problem:   Pyelonephritis Active Problems:   SIRS due to Gram-negative infection ([place])   E-coli UTI   1. Pyonephritis due to e.coli - causing sepsis 1. IVF 2. Rocephin 3. Sensitivities on the E.Coli are pending 4. Tylenol for fever 5. norco for pain    Code Status: Full Code  Family  Communication: Family at bedside Disposition Plan: Admit to inpatient   Time spent: 70 min  Willistine Ferrall M. [facility] Pager [phone]  If 7AM-7PM, please contact the day team taking care of the patient [email or web address] Password [email or web address] 03/11/2015, 3:47 AM

## 2015-03-12 ENCOUNTER — Telehealth (HOSPITAL_COMMUNITY): Payer: Self-pay

## 2015-03-12 LAB — BASIC METABOLIC PANEL
Anion gap: 11 (ref 5–15)
BUN: <5 mg/dL — ABNORMAL LOW (ref 6–20)
CO2: 24 mmol/L (ref 22–32)
Calcium: 8.5 mg/dL — ABNORMAL LOW (ref 8.9–10.3)
Chloride: 106 mmol/L (ref 101–111)
Creatinine, Ser: 0.64 mg/dL (ref 0.44–1.00)
GFR calc Af Amer: >60 mL/min (ref >60)
GFR calc non Af Amer: >60 mL/min (ref >60)
Glucose, Bld: 98 mg/dL (ref 65–99)
Potassium: 3.1 mmol/L — ABNORMAL LOW (ref 3.5–5.1)
Sodium: 141 mmol/L (ref 135–145)

## 2015-03-12 LAB — CBC
HCT: 26.7 % — ABNORMAL LOW (ref 36.0–46.0)
Hemoglobin: 8.6 g/dL — ABNORMAL LOW (ref 12.0–15.0)
MCH: 24.2 pg — ABNORMAL LOW (ref 26.0–34.0)
MCHC: 32.2 g/dL (ref 30.0–36.0)
MCV: 75.2 fL — ABNORMAL LOW (ref 78.0–100.0)
Platelets: 202 10*3/uL (ref 150–400)
RBC: 3.55 MIL/uL — ABNORMAL LOW (ref 3.87–5.11)
RDW: 17.1 % — ABNORMAL HIGH (ref 11.5–15.5)
WBC: 7.5 10*3/uL (ref 4.0–10.5)

## 2015-03-12 LAB — URINE CULTURE: Culture: NO GROWTH

## 2015-03-12 MED ORDER — HYDROCODONE-ACETAMINOPHEN 5-325 MG PO TABS: 1.0000 | ORAL_TABLET | Freq: Four times a day (QID) | ORAL | Status: DC | PRN

## 2015-03-12 MED ORDER — PROMETHAZINE HCL 25 MG PO TABS: 25.0000 mg | ORAL_TABLET | Freq: Four times a day (QID) | ORAL | Status: DC | PRN

## 2015-03-12 MED ORDER — POTASSIUM CHLORIDE CRYS ER 20 MEQ PO TBCR
40.0000 meq | EXTENDED_RELEASE_TABLET | Freq: Once | ORAL | Status: AC
  Administered 2015-03-12: 40 meq via ORAL
  Filled 2015-03-12: qty 2

## 2015-03-12 MED ORDER — CIPROFLOXACIN HCL 500 MG PO TABS: 500.0000 mg | ORAL_TABLET | Freq: Two times a day (BID) | ORAL | Status: DC

## 2015-03-12 NOTE — Progress Notes (Signed)
Patient discharge teaching given, including activity, diet, follow-up appoints, and medications. Patient verbalized understanding of all discharge instructions. IV access was d/c'd. Vitals are stable. Skin is intact except as charted in most recent assessments. Pt did not let escort/NT/Volunteer escort out; mother with patient stating "I know where to go"

## 2015-03-12 NOTE — Telephone Encounter (Signed)
Post ED Visit - Positive Culture Follow-up  Culture report reviewed by antimicrobial stewardship pharmacist:  []  Heide Guile, Pharm.D., BCPS []  Alycia Rossetti, Pharm.D., BCPS []  [place], Pharm.D., BCPS, AAHIVP []  Legrand Como, Pharm.D., BCPS, AAHIVP []  [employer], Pharm.D. [x]  Lynnda Shields, [place].D.  Positive urine culture Treated with cephalexin, organism sensitive to the same and no further patient follow-up is required at this time.  Ileene Musa 03/12/2015, 9:30 AM

## 2015-03-12 NOTE — Care Management Note (Signed)
Case Management Note  Patient Details  Name: Shannon Lyons MRN: [id number] Date of Birth: Feb 15, 1996  Subjective/Objective:      Date: 03/12/15 Spoke with patient at the bedside along with mom. Introduced self as Tourist information centre manager and explained role in discharge planning and how to be reached. Verified patient lives in town, with mom. Expressed no potential need for no other DME. Verified patient anticipates to go home with family,  at time of discharge and will have full-time  supervision by family at this time to best of their knowledge. Patient confirmed  needing help with their medication. Patient  is driven by mom to MD appointments. Verified patient has PCP [facility].   Plan: CM will continue to follow for discharge planning and [facility] resources.               Action/Plan:   Expected Discharge Date:                  Expected Discharge Plan:  Home/Self Care  In-House Referral:     Discharge planning Services  CM Consult, [facility], Follow-up appt scheduled  Post Acute Care Choice:    Choice offered to:     DME Arranged:    DME Agency:     HH Arranged:    [place] Agency:     Status of Service:  Completed, signed off  Medicare Important Message Given:    Date Medicare IM Given:    Medicare IM give by:    Date Additional Medicare IM Given:    Additional Medicare Important Message give by:     If discussed at [place] of Stay Meetings, dates discussed:    Additional Comments:  Zenon Mayo, RN 03/12/2015, 11:38 AM

## 2015-03-12 NOTE — Discharge Summary (Signed)
Physician Discharge Summary   Patient ID: Shannon Lyons MRN: [id number] DOB/AGE: 1995/09/29 19 y.o.  Admit date: 03/10/2015 Discharge date: 03/12/2015  Primary Care Physician:  No primary care provider on file.  Discharge Diagnoses:    . bilateral Pyelonephritis . Sepsis secondary to UTI ([place]) . E-coli UTI  Consults: None   Recommendations for Outpatient Follow-up:  Patient is improving on oral ciprofloxacin, please follow blood cultures  TESTS THAT NEED FOLLOW-UP CBC, BMET   DIET: Regular diet    Allergies:  No Known Allergies   Discharge Medications:   Medication List    STOP taking these medications        cephALEXin 500 MG capsule  Commonly known as:  KEFLEX      TAKE these medications        acetaminophen 500 MG tablet  Commonly known as:  TYLENOL  Take 500 mg by mouth every 6 (six) hours as needed for moderate pain.     ciprofloxacin 500 MG tablet  Commonly known as:  CIPRO  Take 1 tablet (500 mg total) by mouth 2 (two) times daily. X 14days     HYDROcodone-acetaminophen 5-325 MG tablet  Commonly known as:  NORCO/VICODIN  Take 1 tablet by mouth every 6 (six) hours as needed for severe pain.     promethazine 25 MG tablet  Commonly known as:  PHENERGAN  Take 1 tablet (25 mg total) by mouth every 6 (six) hours as needed for nausea or vomiting.         Brief H and P: For complete details please refer to admission H and P, but in brief Shannon Lyons is a 19 y.o. female presents to the ED with persistent L flank pain and fever. Patient was seen in ED the day prior to admission, found to have UTI, given IV rocephin and discharged home with ABx. Despite taking ABx at home her symptoms have persisted and she returned to the ED today with 10/10 flank pain. Fevers 103 at home (103.2 in ED). Patient also reported nausea and vomiting.  Hospital Course:  Pyelonephritis secondary to Escherichia coli UTI - CT abdomen showed multiple areas of  decreased enhancement in the periphery of both kidneys particularly on the left compatible with bilateral pyelonephritis. Urine culture from 10/16 showed Escherichia coli - Patient was placed on IV Rocephin. Per sensitivities, transitioned to oral ciprofloxacin for 2 weeks. She is tolerating solid diet without any difficulty.    Sepsis secondary to UTI ([place]) - Blood cultures negative so far  Day of Discharge BP 99/47 mmHg  Pulse 66  Temp(Src) 98.6 F (37 C) (Oral)  Resp 18  Ht 5\' 3"  (1.6 m)  Wt 58.968 kg (130 lb)  BMI 23.03 kg/m2  SpO2 100%  LMP 02/17/2015  Physical Exam: General: Alert and awake oriented x3 not in any acute distress. HEENT: anicteric sclera, pupils reactive to light and accommodation CVS: S1-S2 clear no murmur rubs or gallops Chest: clear to auscultation bilaterally, no wheezing rales or rhonchi Abdomen: soft nontender, nondistended, normal bowel sounds Extremities: no cyanosis, clubbing or edema noted bilaterally Neuro: Cranial nerves II-XII intact, no focal neurological deficits   The results of significant diagnostics from this hospitalization (including imaging, microbiology, ancillary and laboratory) are listed below for reference.    LAB RESULTS: Basic Metabolic Panel:  Recent Labs Lab 03/10/15 2339 03/12/15 0509  NA 131* 141  K 3.0* 3.1*  CL 100* 106  CO2 21* 24  GLUCOSE 137* 98  BUN <5* <5*  CREATININE 0.78 0.64  CALCIUM 8.4* 8.5*   Liver Function Tests:  Recent Labs Lab 03/09/15 1252 03/10/15 2339  AST 28 29  ALT 18 20  ALKPHOS 53 60  BILITOT 0.4 0.4  PROT 7.3 7.5  ALBUMIN 3.4* 3.1*   No results for input(s): LIPASE, AMYLASE in the last 168 hours. No results for input(s): AMMONIA in the last 168 hours. CBC:  Recent Labs Lab 03/10/15 2339 03/12/15 0509  WBC 18.2* 7.5  NEUTROABS 14.5*  --   HGB 10.7* 8.6*  HCT 33.2* 26.7*  MCV 73.3* 75.2*  PLT 244 202   Cardiac Enzymes: No results for input(s): CKTOTAL, CKMB,  CKMBINDEX, TROPONINI in the last 168 hours. BNP: Invalid input(s): POCBNP CBG: No results for input(s): GLUCAP in the last 168 hours.  Significant Diagnostic Studies:  Dg Chest 2 View  03/10/2015  CLINICAL DATA:  Cough and fever for 1 week EXAM: CHEST - 2 VIEW COMPARISON:  03/09/1959 FINDINGS: The heart size and mediastinal contours are within normal limits. Both lungs are clear. The visualized skeletal structures are unremarkable. IMPRESSION: No active disease. Electronically Signed   By: Inez Catalina M.D.   On: 03/10/2015 23:38   Ct Abdomen Pelvis W Contrast  03/11/2015  CLINICAL DATA:  Acute onset of left flank pain, with fever and chills. Initial encounter. EXAM: CT ABDOMEN AND PELVIS WITH CONTRAST TECHNIQUE: Multidetector CT imaging of the abdomen and pelvis was performed using the standard protocol following bolus administration of intravenous contrast. CONTRAST:  147mL OMNIPAQUE IOHEXOL 300 MG/ML  SOLN COMPARISON:  CT of the abdomen and pelvis from 10/06/2010 FINDINGS: Minimal bibasilar atelectasis is noted. The liver and spleen are unremarkable in appearance. The gallbladder is within normal limits. The pancreas and adrenal glands are unremarkable. Multiple areas of decreased enhancement are noted in the periphery of both kidneys, particularly on the left, with mild overlying soft tissue stranding, compatible with bilateral pyelonephritis. There is no evidence of hydronephrosis. No renal or ureteral stones are seen. No free fluid is identified. The small bowel is unremarkable in appearance. The stomach is within normal limits. No acute vascular abnormalities are seen. The appendix is normal in caliber and contains air, without evidence of appendicitis. The colon is unremarkable in appearance. The bladder is mildly distended and grossly unremarkable. The uterus is within normal limits. The right ovary is unremarkable in appearance. The left ovary is not well characterized. No suspicious adnexal  masses are seen. No inguinal lymphadenopathy is seen. No acute osseous abnormalities are identified. IMPRESSION: Multiple areas of decreased enhancement in the periphery of both kidneys, particularly on the left, with mild overlying soft tissue stranding, compatible with bilateral pyelonephritis. Electronically Signed   By: Garald Balding M.D.   On: 03/11/2015 01:55    2D ECHO:   Disposition and Follow-up:     Discharge Instructions    Diet general    Complete by:  As directed      Increase activity slowly    Complete by:  As directed             DISPOSITION home    DISCHARGE FOLLOW-UP Follow-up Information    Follow up with [facility] On 03/14/2015.   Why:  12 noon for hospital follow up, bring $20 co pay   Contact information:   [place] [id number] [phone]       Time spent on Discharge: 25 mins   Signed:   Atalya Dano M.D. [facility] 03/12/2015, 12:07 PM Pager: [phone]

## 2015-03-14 ENCOUNTER — Inpatient Hospital Stay: Payer: Self-pay

## 2015-03-16 LAB — CULTURE, BLOOD (ROUTINE X 2)
Culture: NO GROWTH
Culture: NO GROWTH

## 2019-03-21 ENCOUNTER — Encounter (HOSPITAL_COMMUNITY): Payer: Self-pay

## 2019-03-21 ENCOUNTER — Emergency Department (HOSPITAL_COMMUNITY)
Admission: EM | Admit: 2019-03-21 | Discharge: 2019-03-21 | Disposition: A | Discharge status: Home / Self Care | Payer: No Typology Code available for payment source | Attending: Emergency Medicine | Admitting: Emergency Medicine

## 2019-03-21 ENCOUNTER — Emergency Department (HOSPITAL_COMMUNITY): Payer: Self-pay | Attending: Emergency Medicine

## 2019-03-21 DIAGNOSIS — Y939 Activity, unspecified: Secondary | ICD-10-CM | POA: Insufficient documentation

## 2019-03-21 DIAGNOSIS — S60032A Contusion of left middle finger without damage to nail, initial encounter: Secondary | ICD-10-CM | POA: Diagnosis not present

## 2019-03-21 DIAGNOSIS — S60042A Contusion of left ring finger without damage to nail, initial encounter: Secondary | ICD-10-CM | POA: Diagnosis not present

## 2019-03-21 DIAGNOSIS — W231XXA Caught, crushed, jammed, or pinched between stationary objects, initial encounter: Secondary | ICD-10-CM | POA: Insufficient documentation

## 2019-03-21 DIAGNOSIS — S60022A Contusion of left index finger without damage to nail, initial encounter: Secondary | ICD-10-CM | POA: Insufficient documentation

## 2019-03-21 DIAGNOSIS — Y929 Unspecified place or not applicable: Secondary | ICD-10-CM | POA: Insufficient documentation

## 2019-03-21 DIAGNOSIS — Y990 Civilian activity done for income or pay: Secondary | ICD-10-CM | POA: Insufficient documentation

## 2019-03-21 DIAGNOSIS — S6992XA Unspecified injury of left wrist, hand and finger(s), initial encounter: Secondary | ICD-10-CM | POA: Diagnosis present

## 2019-03-21 DIAGNOSIS — S6000XA Contusion of unspecified finger without damage to nail, initial encounter: Secondary | ICD-10-CM

## 2019-03-21 NOTE — Discharge Instructions (Signed)
Take Motrin and Tylenol as needed as directed for pain.  Apply ice and elevate for 30 minutes at a time 3 times daily.  Recheck with your Worker's Comp. provider in 2 days.

## 2019-03-21 NOTE — ED Triage Notes (Signed)
Pt's states her left hand got caught between boxes . Bruising and swelling noted to index finger and ring finger.

## 2019-03-21 NOTE — ED Provider Notes (Signed)
[facility] DEPT Provider Note   CSN: [id number] Arrival date & time: 03/21/19  1058     History   Chief Complaint Chief Complaint  Patient presents with  . Finger Injury    HPI Shannon Lyons is a 23 y.o. female.     23 year old female presents with complaint of injury to her left second third and fourth fingers.  Patient states she was carrying a box at work when her right hand got smashed between the box was carrying and another box.  Patient is right-hand dominant, no open wounds or bleeding, reports bruising, swelling, pain to the 3 fingers.  No other injuries, complaints, concerns.     Past Medical History:  Diagnosis Date  . Pyelonephritis 02/2015  . UTI (lower urinary tract infection)     Patient Active Problem List   Diagnosis Date Noted  . Pyelonephritis 03/11/2015  . Sepsis secondary to UTI ([place]) 03/11/2015  . E-coli UTI 03/11/2015    Past Surgical History:  Procedure Laterality Date  . TUMOR REMOVAL       OB History   No obstetric history on file.      Home Medications    Prior to Admission medications   Medication Sig Start Date End Date Taking? Authorizing Provider  acetaminophen (TYLENOL) 500 MG tablet Take 500 mg by mouth every 6 (six) hours as needed for moderate pain.    [provider]  ciprofloxacin (CIPRO) 500 MG tablet Take 1 tablet (500 mg total) by mouth 2 (two) times daily. X 14days 03/12/15   Rai, Ripudeep K, MD  HYDROcodone-acetaminophen (NORCO/VICODIN) 5-325 MG tablet Take 1 tablet by mouth every 6 (six) hours as needed for severe pain. 03/12/15   Rai, Vernelle Emerald, MD  promethazine (PHENERGAN) 25 MG tablet Take 1 tablet (25 mg total) by mouth every 6 (six) hours as needed for nausea or vomiting. 03/12/15   Mendel Corning, MD    Family History History reviewed. No pertinent family history.  Social History Social History   Tobacco Use  . Smoking status: Never Smoker  . Smokeless  tobacco: Never Used  Substance Use Topics  . Alcohol use: No  . Drug use: No     Allergies   Patient has no known allergies.   Review of Systems Review of Systems  Constitutional: Negative for fever.  Musculoskeletal: Positive for arthralgias, joint swelling and myalgias.  Skin: Negative for rash and wound.  Allergic/Immunologic: Negative for immunocompromised state.  Neurological: Negative for weakness and numbness.  Hematological: Does not bruise/bleed easily.  Psychiatric/Behavioral: Negative for confusion.  All other systems reviewed and are negative.    Physical Exam Updated Vital Signs BP 138/74   Pulse 67   Temp 98.7 F (37.1 C) (Oral)   Resp 16   SpO2 99%   Physical Exam Vitals signs and nursing note reviewed.  Constitutional:      General: She is not in acute distress.    Appearance: She is well-developed. She is not diaphoretic.  HENT:     Head: Normocephalic and atraumatic.  Cardiovascular:     Pulses: Normal pulses.  Pulmonary:     Effort: Pulmonary effort is normal.  Musculoskeletal:        General: Swelling, tenderness and signs of injury present. No deformity.     Left hand: She exhibits decreased range of motion, tenderness and swelling. She exhibits normal capillary refill and no deformity. Normal sensation noted. Normal strength noted.  Hands:  Skin:    General: Skin is warm and dry.     Findings: No erythema or rash.  Neurological:     Mental Status: She is alert and oriented to person, place, and time.     Sensory: No sensory deficit.  Psychiatric:        Behavior: Behavior normal.      ED Treatments / Results  Labs (all labs ordered are listed, but only abnormal results are displayed) Labs Reviewed - No data to display  EKG None  Radiology Dg Hand Complete Left  Result Date: 03/21/2019 CLINICAL DATA:  Left hand crushed between boxes.  Bruising, swelling EXAM: LEFT HAND - COMPLETE 3+ VIEW COMPARISON:  None. FINDINGS:  There is no evidence of fracture or dislocation. There is no evidence of arthropathy or other focal bone abnormality. Soft tissues are unremarkable. IMPRESSION: Negative. Electronically Signed   By: Rolm Baptise M.D.   On: 03/21/2019 11:51    Procedures Procedures (including critical care time)  Medications Ordered in ED Medications - No data to display   Initial Impression / Assessment and Plan / ED Course  I have reviewed the triage vital signs and the nursing notes.  Pertinent labs & imaging results that were available during my care of the patient were reviewed by me and considered in my medical decision making (see chart for details).  Clinical Course as of Mar 20 1204  Wed Mar 21, 8087  [id number] 23 year old right-hand-dominant female presents with pain to the left second third and fourth fingers after her fingers became pinched between 2 boxes at work today.  On exam patient has swelling and tenderness to the left second third and fourth fingers, sensation intact, normal capillary refill, is able to flex and extend the fingers.  X-ray of the left hand and fingers is unremarkable.  Patient's fingers to be buddy taped, recommend ice and elevate, take Motrin and Tylenol and follow-up with employee health in 2 days.   [LM]    Clinical Course User Index [LM] Tacy Learn, PA-C      Final Clinical Impressions(s) / ED Diagnoses   Final diagnoses:  Contusion of finger without damage to nail, unspecified finger, unspecified laterality, initial encounter    ED Discharge Orders    None       Tacy Learn, PA-C 03/21/19 [place], [place], DO 03/21/19 1240

## 2019-08-17 ENCOUNTER — Ambulatory Visit: Payer: Self-pay | Attending: Internal Medicine

## 2019-12-25 ENCOUNTER — Ambulatory Visit (INDEPENDENT_AMBULATORY_CARE_PROVIDER_SITE_OTHER): Payer: Self-pay

## 2019-12-25 ENCOUNTER — Other Ambulatory Visit: Payer: Self-pay

## 2019-12-25 DIAGNOSIS — Z3201 Encounter for pregnancy test, result positive: Secondary | ICD-10-CM

## 2019-12-25 LAB — POCT PREGNANCY, URINE: Preg Test, Ur: POSITIVE — AB

## 2019-12-25 NOTE — Progress Notes (Signed)
Pt left urine for pregnancy test.  Resulted positive.  With [facility] # [phone] and L/M that her results are positive.  Denies any vaginal bleeding or pain.  LMP 11/05/19, EDD 08/11/20, and 7w 1d today.  Medications/allergies reviewed.  Pt advised that she should continue to take PNV and begin OB care.  Pt verbalized understanding.  Mel Almond, RN 12/25/19

## 2019-12-26 NOTE — Progress Notes (Signed)
Patient was assessed and managed by nursing staff during this encounter. I have reviewed the chart and agree with the documentation and plan. I have also made any necessary editorial changes.  Griffin Basil, MD 12/26/2019 8:26 AM

## 2020-01-03 ENCOUNTER — Telehealth: Payer: Self-pay | Admitting: *Deleted

## 2020-01-03 NOTE — Telephone Encounter (Signed)
Pt left VM message stating that yesterday she had some dizziness in the morning. Later in the [place] she noticed vaginal discharge which was light brown and pinkish. She is not having any pain and wants to know if she needs to get checked. I returned call to pt and heard message stating that the mailbox is full - unable to leave message. MyChart message sent to pt however per review, she recently signed up and has not yet read messages.

## 2020-01-07 ENCOUNTER — Other Ambulatory Visit: Payer: Self-pay

## 2020-01-07 ENCOUNTER — Telehealth (INDEPENDENT_AMBULATORY_CARE_PROVIDER_SITE_OTHER): Payer: Self-pay

## 2020-01-07 DIAGNOSIS — R110 Nausea: Secondary | ICD-10-CM

## 2020-01-07 DIAGNOSIS — Z3400 Encounter for supervision of normal first pregnancy, unspecified trimester: Secondary | ICD-10-CM | POA: Insufficient documentation

## 2020-01-07 DIAGNOSIS — Z3401 Encounter for supervision of normal first pregnancy, first trimester: Secondary | ICD-10-CM

## 2020-01-07 DIAGNOSIS — O26899 Other specified pregnancy related conditions, unspecified trimester: Secondary | ICD-10-CM

## 2020-01-07 DIAGNOSIS — O219 Vomiting of pregnancy, unspecified: Secondary | ICD-10-CM

## 2020-01-07 MED ORDER — PROMETHAZINE HCL 25 MG PO TABS: 25.0000 mg | ORAL_TABLET | Freq: Four times a day (QID) | ORAL | 0 refills | Status: DC | PRN

## 2020-01-07 NOTE — Progress Notes (Addendum)
New OB Intake  I connected with  Shannon Lyons on 01/07/20 at 1316 by MyChart and verified that I am speaking with the correct person using two identifiers. Nurse is located at [facility] and pt is located at place of work in [place], [place].  I discussed the limitations, risks, security and privacy concerns of performing an evaluation and management service by telephone and the availability of in person appointments. I also discussed with the patient that there may be a patient responsible charge related to this service. The patient expressed understanding and agreed to proceed.  I explained I am completing New OB Intake today. We discussed her EDD of 08/11/20 that is based on LMP of 11/05/19. Pt is G1P0. I reviewed her allergies, medications, Medical/Surgical/OB history, and appropriate screenings. I informed her of [facility] services. Based on history, this is a low risk first pregnancy.  Complaints addressed today Pt reports ongoing nausea and vomiting. Phenergan ordered per protocol. Pt given list of otc medications via MyChart to also try for nausea and occasional constipation.   MyChart/Babyscripts MyChart access verified. I explained pt will have some visits in office and some virtually. Babyscripts instructions given. Account successfully created and app downloaded.  Blood Pressure Cuff Pt's insurance is [employer]. Due to this, pt instructed to purchase BP cuff and bring to first prenatal appt. Explained after first prenatal appt pt will check weekly and document in 56.  Anatomy US Explained first scheduled [place] will be around 19 weeks. Anatomy US scheduled for 03/17/20 at 0900. Pt notified to arrive at [place].  Labs Discussed Shannon Lyons genetic screening with patient. Would like both Panorama and Horizon drawn at new OB visit. Routine prenatal labs needed.  First visit review I reviewed new OB appt with pt. I explained she will have a pelvic exam, ob bloodwork with genetic  screening, and PAP smear. Explained pt will be seen by Shannon Lyons, CNM at first visit; encounter routed to appropriate provider.  Shannon Howells, RN 01/07/2020  1:19 PM  Chart reviewed for nurse visit. Agree with plan of care.   Shannon Rochester, NP 01/09/2020 8:01 AM

## 2020-01-07 NOTE — Patient Instructions (Signed)
  At [facility], we work as an integrated team, providing care to address both physical and emotional health. Your medical provider may refer you to see our Behavioral Health Clinician (BHC) on the same day you see your medical provider, as availability permits.  Our BHC is available to all patients, visits generally last between 20-30 minutes, but can be longer or shorter, depending on patient need. The BHC offers help with stress management, coping with symptoms of depression and anxiety, major life changes , sleep issues, changing risky behavior, grief and loss, life stress, working on personal life goals, and  behavioral health issues, as these all affect your overall health and wellness.  The BHC is NOT available for the following: FMLA paperwork, court-ordered evaluations, specialty assessments (custody or disability), letters to employers, or obtaining certification for an emotional support animal. The BHC does not provide long-term therapy. You have the right to refuse integrated behavioral health services, or to reschedule to see the BHC at a later date.  Exception: If you are having thoughts of suicide, we require that you either see the BHC for further assessment, or contract for safety with your medical provider. Confidentiality exception: If it is suspected that a child or disabled adult is being abused or neglected, we are required by law to report that to either Child Protective Services or Adult Protective Services.  If you have a diagnosis of Bipolar affective disorder, Schizophrenia, or recurrent Major depressive disorder, we will recommend that you establish care with a psychiatrist, as these are lifelong, chronic conditions, and we want your overall emotional health and medications to be more closely monitored. If you anticipate needing extended maternity leave due to mental illness, it it recommended you inform your medical provider, so  we can put in a referral to a  psychiatrist as soon as possible. The BHC is unable to recommend an extended maternity leave for mental health issues. Your medical provider or BHC may refer you to a therapist for ongoing, traditional therapy, or to a psychiatrist, for medication management, if it would benefit your overall health. Depending on your insurance, you may have a copay to see the BHC. If you are uninsured, it is recommended that you apply for financial assistance. (Forms may be requested at the front desk for in-person visits, via MyChart, or request a form during a virtual visit).  If you see the BHC more than 6 times, you will have to complete a comprehensive clinical assessment interview with the BHC to resume integrated services.  For virtual visits with the BHC, you must be physically in the state of [place] at the time of the visit. For example, if you live in [place], you will have to do an in-person visit with the BHC. If you are going out of the state or country for any reason, the BHC may see you virtually when you return to [place], but not while you are physically outside of [place].    

## 2020-01-21 ENCOUNTER — Other Ambulatory Visit (HOSPITAL_COMMUNITY)
Admission: RE | Admit: 2020-01-21 | Discharge: 2020-01-21 | Disposition: A | Discharge status: Home / Self Care | Payer: Self-pay | Source: Ambulatory Visit

## 2020-01-21 ENCOUNTER — Other Ambulatory Visit: Payer: Self-pay

## 2020-01-21 ENCOUNTER — Ambulatory Visit (INDEPENDENT_AMBULATORY_CARE_PROVIDER_SITE_OTHER): Payer: Self-pay

## 2020-01-21 VITALS — BP 129/69 | HR 76 | Wt 144.0 lb

## 2020-01-21 DIAGNOSIS — D271 Benign neoplasm of left ovary: Secondary | ICD-10-CM

## 2020-01-21 DIAGNOSIS — R109 Unspecified abdominal pain: Secondary | ICD-10-CM

## 2020-01-21 DIAGNOSIS — Z3401 Encounter for supervision of normal first pregnancy, first trimester: Secondary | ICD-10-CM | POA: Insufficient documentation

## 2020-01-21 DIAGNOSIS — Z124 Encounter for screening for malignant neoplasm of cervix: Secondary | ICD-10-CM

## 2020-01-21 DIAGNOSIS — Z90721 Acquired absence of ovaries, unilateral: Secondary | ICD-10-CM

## 2020-01-21 DIAGNOSIS — Z9079 Acquired absence of other genital organ(s): Secondary | ICD-10-CM | POA: Insufficient documentation

## 2020-01-21 DIAGNOSIS — Z87448 Personal history of other diseases of urinary system: Secondary | ICD-10-CM | POA: Insufficient documentation

## 2020-01-21 MED ORDER — CEPHALEXIN 500 MG PO CAPS: 500.0000 mg | ORAL_CAPSULE | Freq: Four times a day (QID) | ORAL | 0 refills | Status: DC

## 2020-01-21 NOTE — Progress Notes (Signed)
Subjective:   Shannon Lyons is a 24 y.o. G1P0000 at [redacted]w[redacted]d by [employer] LMP of June 14th, 2021 who is being seen today for her first obstetrical visit.    Gynecological/Obstetrical History: Her obstetrical history is unremarkable. Patient does intend to breast feed. Pregnancy history fully reviewed. Patient reports backache in her lower back on her left side.  Patient states it has been ongoing for about one week.  Patient describes the pain as "sharp" and reports that standing after long periods of sitting aggravates the pain and that it can last up to all day.  She states she has not taken anything OTC for the pain, but it is improved with laying down.  She denies issues including pain or discomfort with urination.  Patient with N/V prior to this visit that she reports has improved with phenergan dosing.  Sexual Activity and Vaginal Concerns: Patient denies vaginal concerns including leaking, bleeding, or discharge.  She also denies pain or discomfort during sexual activity. Medical History/ROS: Patient reports unremarkable history. However review of chart shows history of pyleonephritis and sepsis s/t 5 years ago.  Patient confirms this diagnosis.    Social History: Patient denies history of or current usage of tobacco, alcohol, or drugs.  Patient reports the FOB is Shannon Lyons who is involved.  Patient reports that she lives with "my boyfriend Shannon Lyons" and endorses safety at home.  Patient denies DV/A. 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  HISTORY: OB History  Gravida Para Term Preterm AB Living  1 0 0 0 0 0  SAB TAB Ectopic Multiple Live Births  0 0 0 0 0    # Outcome Date GA Lbr Len/2nd Weight Sex Delivery Anes PTL Lv  1 Current             Pap smear was done today and results pending  Past Medical History:  Diagnosis Date  . Pyelonephritis 02/2015  . UTI (lower urinary tract infection)    Past Surgical History:    Procedure Laterality Date  . TUMOR REMOVAL  2012   tumor removed from near right ovary; ovary and tube removed per pt   Family History  Problem Relation Age of Onset  . Diabetes Maternal Grandfather   . Cancer Paternal Grandfather   . Diabetes Maternal Uncle    Social History   Tobacco Use  . Smoking status: Never Smoker  . Smokeless tobacco: Never Used  Vaping Use  . Vaping Use: Never used  Substance Use Topics  . Alcohol use: Not Currently  . Drug use: No   No Known Allergies Current Outpatient Medications on File Prior to Visit  Medication Sig Dispense Refill  . Prenatal Vit-Fe Fumarate-FA (PRENATAL PO) Take by mouth.    . promethazine (PHENERGAN) 25 MG tablet Take 1 tablet (25 mg total) by mouth every 6 (six) hours as needed for nausea or vomiting. 30 tablet 0   No current facility-administered medications on file prior to visit.    Review of Systems Pertinent items noted in HPI and remainder of comprehensive ROS otherwise negative.  Exam   Vitals:   01/21/20 1408  BP: 129/69  Pulse: 76  Weight: 144 lb (65.3 kg)   Fetal Heart Rate (bpm): 161  Physical Exam Constitutional:      Appearance: Normal appearance.  Genitourinary:     No vulval rash noted.     No signs of labial injury.     Vaginal rugosity  present.     No vaginal discharge, erythema, tenderness or bleeding.     Cervix is nulliparous.     Cervical pinkness present.     No cervical motion tenderness, discharge, friability, bleeding, polyp, nabothian cyst or eversion.     Uterus is enlarged.     Genitourinary Comments: Sterile Speculum Exam: -Normal External Genitalia: Non tender, no apparent discharge at introitus.  -Vaginal Vault: Pink mucosa with good rugae. No apparent discharge. -Cervix:Pink, no lesions, cysts, or polyps.  Appears closed. No active bleeding from os-Pap collected with brush and spatula. -Bimanual Exam:  Uterine size difficult to assess d/t position. Enlargement noted. No  apparent tenderness.    HENT:     Head: Normocephalic and atraumatic.  Eyes:     Conjunctiva/sclera: Conjunctivae normal.  Cardiovascular:     Rate and Rhythm: Normal rate and regular rhythm.     Heart sounds: Normal heart sounds.  Pulmonary:     Effort: Pulmonary effort is normal. No respiratory distress.     Breath sounds: Normal breath sounds.  Abdominal:     General: Abdomen is flat. Bowel sounds are normal.     Palpations: Abdomen is soft.     Comments: Vertical Surgical Scar from Symphysis pubis to above umbilicus.   Musculoskeletal:        General: Normal range of motion.     Cervical back: Normal range of motion.  Neurological:     Mental Status: She is alert and oriented to person, place, and time.  Skin:    General: Skin is warm and dry.  Psychiatric:        Mood and Affect: Mood normal.        Behavior: Behavior normal.        Thought Content: Thought content normal.        Judgment: Judgment normal.     Assessment:   24 y.o. year old G1P0000 Patient Active Problem List   Diagnosis Date Noted  . Supervision of low-risk first pregnancy 01/07/2020  . Pyelonephritis 03/11/2015  . Sepsis secondary to UTI ([place]) 03/11/2015  . E-coli UTI 03/11/2015  . 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 Encounter for supervision of low-risk first pregnancy in first trimester -Congratulations given and patient welcomed to practice. -Discussed usage of Bab[facility] and virtual visits as additional source of managing and completing PN visits in midst of coronavirus.   *Instructed to take blood pressure and record weekly into babyscripts. *Reviewed modified prenatal visit schedule and platforms used for virtual visits.  -Anticipatory guidance for prenatal visits including labs, ultrasounds, and testing; Initial labs drawn. -Genetic Screening discussed, First trimester screen, Quad screen and NIPS: ordered. -Encouraged to utilize [facility] for her  ability to review results, send requests, and have questions addressed.  -Discussed estimated due date of August 11, 2020. -Ultrasound discussed; fetal anatomic survey: previously ordered and scheduled. -Continue prenatal vitamins. -Influenza vaccine discussed and recommended.  -Encouraged to seek out care at office or emergency room for urgent and/or emergent concerns as well as MAU for all pregnancy related concerns.  -Educated on the nature of [facility] with multiple MDs and other Advanced Practice Providers was explained to patient; also emphasized that residents, students are part of our team. Informed of her right to refuse care as she deems appropriate.  -Reviewed [email or web address] and shown how to enroll for breastfeeding and CB courses online.   2. Acute left flank pain -Negative  CVAT.  3. History of pyelonephritis -Due to history and current c/o flank pain will start on Keflex QID 500mg  -Informed that patient will be contacted, via mychart, for any modifications or need for discontinuation.  4. Routine cervical smear -Pap smear collected. -Patient introduced to instruments used in well woman exam.  5. Benign teratoma of ovary, left -Removed; history in Care Everywhere  6. History of salpingo-oophorectomy -Left side removed. History in Care Everywhere   Problem list reviewed and updated. Routine obstetric precautions reviewed.  Orders Placed This Encounter  Procedures  . Culture, OB Urine  . CBC/D/Plt+RPR+Rh+ABO+Rub Ab...  . Genetic Screening    [facility]    No follow-ups on file.     Maryann Conners, CNM 01/21/2020 2:31 PM

## 2020-01-21 NOTE — Patient Instructions (Signed)
Second Trimester of Pregnancy The second trimester is from week 14 through week 27 (months 4 through 6). The second trimester is often a time when you feel your best. Your body has adjusted to being pregnant, and you begin to feel better physically. Usually, morning sickness has lessened or quit completely, you may have more energy, and you may have an increase in appetite. The second trimester is also a time when the fetus is growing rapidly. At the end of the sixth month, the fetus is about 9 inches long and weighs about 1 pounds. You will likely begin to feel the baby move (quickening) between 16 and 20 weeks of pregnancy. Body changes during your second trimester Your body continues to go through many changes during your second trimester. The changes vary from woman to woman.  Your weight will continue to increase. You will notice your lower abdomen bulging out.  You may begin to get stretch marks on your hips, abdomen, and breasts.  You may develop headaches that can be relieved by medicines. The medicines should be approved by your health care provider.  You may urinate more often because the fetus is pressing on your bladder.  You may develop or continue to have heartburn as a result of your pregnancy.  You may develop constipation because certain hormones are causing the muscles that push waste through your intestines to slow down.  You may develop hemorrhoids or swollen, bulging veins (varicose veins).  You may have back pain. This is caused by: ? Weight gain. ? Pregnancy hormones that are relaxing the joints in your pelvis. ? A shift in weight and the muscles that support your balance.  Your breasts will continue to grow and they will continue to become tender.  Your gums may bleed and may be sensitive to brushing and flossing.  Dark spots or blotches (chloasma, mask of pregnancy) may develop on your face. This will likely fade after the baby is born.  A dark line from your  belly button to the pubic area (linea nigra) may appear. This will likely fade after the baby is born.  You may have changes in your hair. These can include thickening of your hair, rapid growth, and changes in texture. Some women also have hair loss during or after pregnancy, or hair that feels dry or thin. Your hair will most likely return to normal after your baby is born. What to expect at prenatal visits During a routine prenatal visit:  You will be weighed to make sure you and the fetus are growing normally.  Your blood pressure will be taken.  Your abdomen will be measured to track your baby's growth.  The fetal heartbeat will be listened to.  Any test results from the previous visit will be discussed. Your health care provider may ask you:  How you are feeling.  If you are feeling the baby move.  If you have had any abnormal symptoms, such as leaking fluid, bleeding, severe headaches, or abdominal cramping.  If you are using any tobacco products, including cigarettes, chewing tobacco, and electronic cigarettes.  If you have any questions. Other tests that may be performed during your second trimester include:  Blood tests that check for: ? Low iron levels (anemia). ? High blood sugar that affects pregnant women (gestational diabetes) between 24 and 28 weeks. ? Rh antibodies. This is to check for a protein on red blood cells (Rh factor).  Urine tests to check for infections, diabetes, or protein in the   urine.  An ultrasound to confirm the proper growth and development of the baby.  An amniocentesis to check for possible genetic problems.  Fetal screens for spina bifida and Down syndrome.  HIV (human immunodeficiency virus) testing. Routine prenatal testing includes screening for HIV, unless you choose not to have this test. Follow these instructions at home: Medicines  Follow your health care provider's instructions regarding medicine use. Specific medicines may be  either safe or unsafe to take during pregnancy.  Take a prenatal vitamin that contains at least 600 micrograms (mcg) of folic acid.  If you develop constipation, try taking a stool softener if your health care provider approves. Eating and drinking   Eat a balanced diet that includes fresh fruits and vegetables, whole grains, good sources of protein such as meat, eggs, or tofu, and low-fat dairy. Your health care provider will help you determine the amount of weight gain that is right for you.  Avoid raw meat and uncooked cheese. These carry germs that can cause birth defects in the baby.  If you have low calcium intake from food, talk to your health care provider about whether you should take a daily calcium supplement.  Limit foods that are high in fat and processed sugars, such as fried and sweet foods.  To prevent constipation: ? Drink enough fluid to keep your urine clear or pale yellow. ? Eat foods that are high in fiber, such as fresh fruits and vegetables, whole grains, and beans. Activity  Exercise only as directed by your health care provider. Most women can continue their usual exercise routine during pregnancy. Try to exercise for 30 minutes at least 5 days a week. Stop exercising if you experience uterine contractions.  Avoid heavy lifting, wear low heel shoes, and practice good posture.  A sexual relationship may be continued unless your health care provider directs you otherwise. Relieving pain and discomfort  Wear a good support bra to prevent discomfort from breast tenderness.  Take warm sitz baths to soothe any pain or discomfort caused by hemorrhoids. Use hemorrhoid cream if your health care provider approves.  Rest with your legs elevated if you have leg cramps or low back pain.  If you develop varicose veins, wear support hose. Elevate your feet for 15 minutes, 3-4 times a day. Limit salt in your diet. Prenatal Care  Write down your questions. Take them to  your prenatal visits.  Keep all your prenatal visits as told by your health care provider. This is important. Safety  Wear your seat belt at all times when driving.  Make a list of emergency phone numbers, including numbers for family, friends, the hospital, and police and fire departments. General instructions  Ask your health care provider for a referral to a local prenatal education class. Begin classes no later than the beginning of month 6 of your pregnancy.  Ask for help if you have counseling or nutritional needs during pregnancy. Your health care provider can offer advice or refer you to specialists for help with various needs.  Do not use hot tubs, steam rooms, or saunas.  Do not douche or use tampons or scented sanitary pads.  Do not cross your legs for long periods of time.  Avoid cat litter boxes and soil used by cats. These carry germs that can cause birth defects in the baby and possibly loss of the fetus by miscarriage or stillbirth.  Avoid all smoking, herbs, alcohol, and unprescribed drugs. Chemicals in these products can affect the formation   and growth of the baby.  Do not use any products that contain nicotine or tobacco, such as cigarettes and e-cigarettes. If you need help quitting, ask your health care provider.  Visit your dentist if you have not gone yet during your pregnancy. Use a soft toothbrush to brush your teeth and be gentle when you floss. Contact a health care provider if:  You have dizziness.  You have mild pelvic cramps, pelvic pressure, or nagging pain in the abdominal area.  You have persistent nausea, vomiting, or diarrhea.  You have a bad smelling vaginal discharge.  You have pain when you urinate. Get help right away if:  You have a fever.  You are leaking fluid from your vagina.  You have spotting or bleeding from your vagina.  You have severe abdominal cramping or pain.  You have rapid weight gain or weight loss.  You have  shortness of breath with chest pain.  You notice sudden or extreme swelling of your face, hands, ankles, feet, or legs.  You have not felt your baby move in over an hour.  You have severe headaches that do not go away when you take medicine.  You have vision changes. Summary  The second trimester is from week 14 through week 27 (months 4 through 6). It is also a time when the fetus is growing rapidly.  Your body goes through many changes during pregnancy. The changes vary from woman to woman.  Avoid all smoking, herbs, alcohol, and unprescribed drugs. These chemicals affect the formation and growth your baby.  Do not use any tobacco products, such as cigarettes, chewing tobacco, and e-cigarettes. If you need help quitting, ask your health care provider.  Contact your health care provider if you have any questions. Keep all prenatal visits as told by your health care provider. This is important. This information is not intended to replace advice given to you by your health care provider. Make sure you discuss any questions you have with your health care provider. Document Revised: 09/01/2018 Document Reviewed: 06/15/2016 Elsevier Patient Education  [place]. Constipation, Adult Constipation is when a person has fewer bowel movements in a week than normal, has difficulty having a bowel movement, or has stools that are dry, hard, or larger than normal. Constipation may be caused by an underlying condition. It may become worse with age if a person takes certain medicines and does not take in enough fluids. Follow these instructions at home: Eating and drinking   Eat foods that have a lot of fiber, such as fresh fruits and vegetables, whole grains, and beans.  Limit foods that are high in fat, low in fiber, or overly processed, such as french fries, hamburgers, cookies, candies, and soda.  Drink enough fluid to keep your urine clear or pale yellow. General  instructions  Exercise regularly or as told by your health care provider.  Go to the restroom when you have the urge to go. Do not hold it in.  Take over-the-counter and prescription medicines only as told by your health care provider. These include any fiber supplements.  Practice pelvic floor retraining exercises, such as deep breathing while relaxing the lower abdomen and pelvic floor relaxation during bowel movements.  Watch your condition for any changes.  Keep all follow-up visits as told by your health care provider. This is important. Contact a health care provider if:  You have pain that gets worse.  You have a fever.  You do not have a bowel  movement after 4 days.  You vomit.  You are not hungry.  You lose weight.  You are bleeding from the anus.  You have thin, pencil-like stools. Get help right away if:  You have a fever and your symptoms suddenly get worse.  You leak stool or have blood in your stool.  Your abdomen is bloated.  You have severe pain in your abdomen.  You feel dizzy or you faint. This information is not intended to replace advice given to you by your health care provider. Make sure you discuss any questions you have with your health care provider. Document Revised: 04/22/2017 Document Reviewed: 10/29/2015 Elsevier Patient Education  [employer] Why am I having this test? The alpha-fetoprotein test is most commonly used in pregnant women to help screen for birth defects in their unborn baby. It can be used to screen for birth defects, such as chromosome (DNA) abnormalities, problems with the brain or spinal cord, or problems with the abdominal wall of the unborn baby (fetus). The alpha-fetoprotein test may also be done for men or non-pregnant women to check for certain cancers. What is being tested? This test measures the amount of alpha-fetoprotein (AFP) in your blood. AFP is a protein that is made by the  liver. Levels can be detected in the mother's blood during pregnancy, starting at 10 weeks and peaking at 16-18 weeks of the pregnancy. Abnormal levels can sometimes be a sign of a birth defect in the baby. Certain cancers can cause a high level of AFP in men and non-pregnant women. What kind of sample is taken?  A blood sample is required for this test. It is usually collected by inserting a needle into a blood vessel. How are the results reported? Your test results will be reported as values. Your health care provider will compare your results to normal ranges that were established after testing a large group of people (reference values). Reference values may vary among labs and hospitals. For this test, common reference values are:  Adult: Less than 40 ng/mL or less than 40 mcg/L (SI units).  Child younger than 1 year: Less than 30 ng/mL. If you are pregnant, the values may also vary based on how long you have been pregnant. What do the results mean? Results that are above the reference values in pregnant women may indicate the following for the baby:  Neural tube defects, such as abnormalities of the spinal cord or brain.  Abdominal wall defects.  Multiple pregnancy such as twins.  Fetal distress or fetal death. Results that are above the reference values in men or non-pregnant women may indicate:  Reproductive cancers, such as ovarian or testicular cancer.  Liver cancer.  Liver cell death.  Other types of cancer. Very low levels of AFP in pregnant women may indicate the following for the baby:  Down syndrome.  Fetal death. Talk with your health care provider about what your results mean. Questions to ask your health care provider Ask your health care provider, or the department that is doing the test:  When will my results be ready?  How will I get my results?  What are my treatment options?  What other tests do I need?  What are my next steps? Summary  The  alpha-fetoprotein test is done on pregnant women to help screen for birth defects in their unborn baby.  Certain cancers can cause a high level of AFP in men and non-pregnant women.  For this test, a  blood sample is usually collected by inserting a needle into a blood vessel.  Talk with your health care provider about what your results mean. This information is not intended to replace advice given to you by your health care provider. Make sure you discuss any questions you have with your health care provider. Document Revised: 04/22/2017 Document Reviewed: 12/14/2016 Elsevier Patient Education  [place].

## 2020-01-22 LAB — CBC/D/PLT+RPR+RH+ABO+RUB AB...
Antibody Screen: NEGATIVE
Basophils Absolute: 0 10*3/uL (ref 0.0–0.2)
Basos: 0 %
EOS (ABSOLUTE): 0.1 10*3/uL (ref 0.0–0.4)
Eos: 1 %
HCV Ab: <0.1 s/co ratio (ref 0.0–0.9)
HIV Screen 4th Generation wRfx: NONREACTIVE
Hematocrit: 40.8 % (ref 34.0–46.6)
Hemoglobin: 13.6 g/dL (ref 11.1–15.9)
Hepatitis B Surface Ag: NEGATIVE
Immature Grans (Abs): 0 10*3/uL (ref 0.0–0.1)
Immature Granulocytes: 1 %
Lymphocytes Absolute: 1.2 10*3/uL (ref 0.7–3.1)
Lymphs: 15 %
MCH: 28.8 pg (ref 26.6–33.0)
MCHC: 33.3 g/dL (ref 31.5–35.7)
MCV: 86 fL (ref 79–97)
Monocytes Absolute: 0.6 10*3/uL (ref 0.1–0.9)
Monocytes: 7 %
Neutrophils Absolute: 6.3 10*3/uL (ref 1.4–7.0)
Neutrophils: 76 %
Platelets: 233 10*3/uL (ref 150–450)
RBC: 4.73 x10E6/uL (ref 3.77–5.28)
RDW: 13 % (ref 11.7–15.4)
RPR Ser Ql: NONREACTIVE
Rh Factor: POSITIVE
Rubella Antibodies, IGG: 5.87 index (ref >0.99)
WBC: 8.2 10*3/uL (ref 3.4–10.8)

## 2020-01-22 LAB — HCV INTERPRETATION

## 2020-01-23 LAB — CYTOLOGY - PAP
Chlamydia: NEGATIVE
Comment: NEGATIVE
Comment: NEGATIVE
Comment: NORMAL
Diagnosis: NEGATIVE
High risk HPV: NEGATIVE
Neisseria Gonorrhea: NEGATIVE

## 2020-01-23 LAB — CULTURE, OB URINE

## 2020-01-23 LAB — URINE CULTURE, OB REFLEX

## 2020-02-28 ENCOUNTER — Other Ambulatory Visit: Payer: Self-pay

## 2020-02-28 ENCOUNTER — Ambulatory Visit (INDEPENDENT_AMBULATORY_CARE_PROVIDER_SITE_OTHER): Payer: Self-pay

## 2020-02-28 VITALS — BP 124/83 | HR 62 | Wt 146.0 lb

## 2020-02-28 DIAGNOSIS — Z3401 Encounter for supervision of normal first pregnancy, first trimester: Secondary | ICD-10-CM

## 2020-02-28 DIAGNOSIS — Z3A16 16 weeks gestation of pregnancy: Secondary | ICD-10-CM

## 2020-02-28 NOTE — Patient Instructions (Signed)

## 2020-02-28 NOTE — Progress Notes (Signed)
   LOW-RISK PREGNANCY OFFICE VISIT  Patient name: Shannon Lyons MRN [id number]  Date of birth: 1995/12/25 Chief Complaint:   Routine Prenatal Visit  Subjective:   Shannon Lyons is a 24 y.o. 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  Patient presents today without complaints. Patient states the flank pain resolved with antibiotic treatment.  She denies abdominal cramping or contractions.  sHE denies vaginal concerns including abnormal discharge, leaking of fluid, and bleeding.  Contractions: Not present. Vag. Bleeding: None.  Movement: Absent.  Reviewed past medical,surgical, social, obstetrical and family history as well as problem list, medications and allergies.  Objective   Vitals:   02/28/20 1347  BP: 124/83  Pulse: 62  Weight: 146 lb (66.2 kg)  Body mass index is 25.86 kg/m.  Total Weight Gain:6 lb (2.722 kg)         Physical Examination:   General appearance: Well appearing, and in no distress  Mental status: Alert, oriented to person, place, and time  Skin: Warm & dry  Cardiovascular: Normal heart rate noted  Respiratory: Normal respiratory effort, no distress  Abdomen: Soft, gravid, nontender, AGA with Fundal height measurement deferred  Pelvic: Cervical exam deferred           Extremities: Edema: None  Fetal Status: Fetal Heart Rate (bpm): 154  Movement: Absent   No results found for this or any previous visit (from the past 24 hour(s)).  Assessment & Plan:  Low-risk pregnancy of a 24 y.o., G1P0000 at [redacted]w[redacted]d with an Estimated Date of Delivery: 08/11/20   1. Encounter for supervision of low-risk first pregnancy in first trimester -Reviewed labs  from previous visit. -Discussed obtaining of AFP today. -Discussed upcoming anatomy US on 03/17/2020.  2. [redacted] weeks gestation of pregnancy -Anticipatory guidance for upcoming appts. -Reassured that it is okay to sleep in whatever position is comfortable right now. -TWG 6 lbs    Meds: No orders of the defined types were placed in this encounter.  Labs/procedures today:  Lab Orders  No laboratory test(s) ordered today     Reviewed: Preterm labor symptoms and general obstetric precautions including but not limited to vaginal bleeding, contractions, leaking of fluid and fetal movement were reviewed in detail with the patient.  All questions were answered.  Follow-up: Return in about 4 weeks (around 03/27/2020) for Virtual LR-ROB.  No orders of the defined types were placed in this encounter.  Maryann Conners MSN, CNM 02/28/2020

## 2020-03-01 LAB — AFP, SERUM, OPEN SPINA BIFIDA
AFP MoM: 0.78
AFP Value: 27.9 ng/mL
Gest. Age on Collection Date: 16.3 weeks
Maternal Age At EDD: 26.3 yr
OSBR Risk 1 IN: 10000
Test Results:: NEGATIVE
Weight: 146 [lb_av]

## 2020-03-17 ENCOUNTER — Ambulatory Visit: Payer: Self-pay

## 2020-03-24 ENCOUNTER — Ambulatory Visit: Payer: BC Managed Care – PPO

## 2020-03-24 ENCOUNTER — Other Ambulatory Visit: Payer: Self-pay

## 2020-03-24 DIAGNOSIS — Z3401 Encounter for supervision of normal first pregnancy, first trimester: Secondary | ICD-10-CM | POA: Diagnosis not present

## 2020-03-24 DIAGNOSIS — Z3A20 20 weeks gestation of pregnancy: Secondary | ICD-10-CM

## 2020-03-24 DIAGNOSIS — Z363 Encounter for antenatal screening for malformations: Secondary | ICD-10-CM

## 2020-04-02 ENCOUNTER — Telehealth (INDEPENDENT_AMBULATORY_CARE_PROVIDER_SITE_OTHER): Payer: BC Managed Care – PPO | Admitting: Student

## 2020-04-02 VITALS — BP 138/70 | HR 93

## 2020-04-02 DIAGNOSIS — Z87448 Personal history of other diseases of urinary system: Secondary | ICD-10-CM

## 2020-04-02 DIAGNOSIS — Z3402 Encounter for supervision of normal first pregnancy, second trimester: Secondary | ICD-10-CM

## 2020-04-02 DIAGNOSIS — Z3A21 21 weeks gestation of pregnancy: Secondary | ICD-10-CM

## 2020-04-02 NOTE — Patient Instructions (Addendum)
Take docusate sodium, also known as Colace, for constipation. You can buy this at any drug store.     -. Glucose Tolerance Test During Pregnancy Why am I having this test? The glucose tolerance test (GTT) is done to check how your body processes sugar (glucose). This is one of several tests used to diagnose diabetes that develops during pregnancy (gestational diabetes mellitus). Gestational diabetes is a temporary form of diabetes that some women develop during pregnancy. It usually occurs during the second trimester of pregnancy and goes away after delivery. Testing (screening) for gestational diabetes usually occurs between 24 and 28 weeks of pregnancy. You may have the GTT test after having a 1-hour glucose screening test if the results from that test indicate that you may have gestational diabetes. You may also have this test if:  You have a history of gestational diabetes.  You have a history of giving birth to very large babies or have experienced repeated fetal loss (stillbirth).  You have signs and symptoms of diabetes, such as: ? Changes in your vision. ? Tingling or numbness in your hands or feet. ? Changes in hunger, thirst, and urination that are not otherwise explained by your pregnancy. What is being tested? This test measures the amount of glucose in your blood at different times during a period of 3 hours. This indicates how well your body is able to process glucose. What kind of sample is taken?  Blood samples are required for this test. They are usually collected by inserting a needle into a blood vessel. How do I prepare for this test?  For 3 days before your test, eat normally. Have plenty of carbohydrate-rich foods.  Follow instructions from your health care provider about: ? Eating or drinking restrictions on the day of the test. You may be asked to not eat or drink anything other than water (fast) starting 8-10 hours before the test. ? Changing or stopping your  regular medicines. Some medicines may interfere with this test. Tell a health care provider about:  All medicines you are taking, including vitamins, herbs, eye drops, creams, and over-the-counter medicines.  Any blood disorders you have.  Any surgeries you have had.  Any medical conditions you have. What happens during the test? First, your blood glucose will be measured. This is referred to as your fasting blood glucose, since you fasted before the test. Then, you will drink a glucose solution that contains a certain amount of glucose. Your blood glucose will be measured again 1, 2, and 3 hours after drinking the solution. This test takes about 3 hours to complete. You will need to stay at the testing location during this time. During the testing period:  Do not eat or drink anything other than the glucose solution.  Do not exercise.  Do not use any products that contain nicotine or tobacco, such as cigarettes and e-cigarettes. If you need help stopping, ask your health care provider. The testing procedure may vary among health care providers and hospitals. How are the results reported? Your results will be reported as milligrams of glucose per deciliter of blood (mg/dL) or millimoles per liter (mmol/L). Your health care provider will compare your results to normal ranges that were established after testing a large group of people (reference ranges). Reference ranges may vary among labs and hospitals. For this test, common reference ranges are:  Fasting: less than 95-105 mg/dL (5.3-5.8 mmol/L).  1 hour after drinking glucose: less than 180-190 mg/dL (10.0-10.5 mmol/L).  2 hours  after drinking glucose: less than 155-165 mg/dL (8.6-9.2 mmol/L).  3 hours after drinking glucose: 140-145 mg/dL (7.8-8.1 mmol/L). What do the results mean? Results within reference ranges are considered normal, meaning that your glucose levels are well-controlled. If two or more of your blood glucose levels  are high, you may be diagnosed with gestational diabetes. If only one level is high, your health care provider may suggest repeat testing or other tests to confirm a diagnosis. Talk with your health care provider about what your results mean. Questions to ask your health care provider Ask your health care provider, or the department that is doing the test:  When will my results be ready?  How will I get my results?  What are my treatment options?  What other tests do I need?  What are my next steps? Summary  The glucose tolerance test (GTT) is one of several tests used to diagnose diabetes that develops during pregnancy (gestational diabetes mellitus). Gestational diabetes is a temporary form of diabetes that some women develop during pregnancy.  You may have the GTT test after having a 1-hour glucose screening test if the results from that test indicate that you may have gestational diabetes. You may also have this test if you have any symptoms or risk factors for gestational diabetes.  Talk with your health care provider about what your results mean. This information is not intended to replace advice given to you by your health care provider. Make sure you discuss any questions you have with your health care provider. Document Revised: 08/31/2018 Document Reviewed: 12/20/2016 Elsevier Patient Education  [place].

## 2020-04-02 NOTE — Progress Notes (Signed)
I connected with Shannon Lyons 04/02/20 at  3:35 PM EST by: MyChart video and verified that I am speaking with the correct person using two identifiers.  Patient is located at home and provider is located at [facility].     The purpose of this virtual visit is to provide medical care while limiting exposure to the novel coronavirus. I discussed the limitations, risks, security and privacy concerns of performing an evaluation and management service by MyChart video and the availability of in person appointments. I also discussed with the patient that there may be a patient responsible charge related to this service. By engaging in this virtual visit, you consent to the provision of healthcare.  Additionally, you authorize for your insurance to be billed for the services provided during this visit.  The patient expressed understanding and agreed to proceed.  The following staff members participated in the virtual visit:  Carver Fila    PRENATAL VISIT NOTE  Subjective:  Shannon Lyons is a 24 y.o. G1P0000 at [redacted]w[redacted]d  for phone visit for ongoing prenatal care.  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  Patient reports had some burning at the beginning of the week. She has a history of UTI and pyelenephritis; she would like to come in and leave a urine sample. She says her urine is clear and has no odor. . She also reports constipation at times.   Contractions: Not present. Vag. Bleeding: None.  Movement: Present. Denies leaking of fluid.   The following portions of the patient's history were reviewed and updated as appropriate: allergies, current medications, past family history, past medical history, past social history, past surgical history and  problem list.   Objective:   Vitals:   04/02/20 1527  BP: 138/70  Pulse: 93   Self-Obtained  Fetal Status:     Movement: Present     Assessment and Plan:  Pregnancy: G1P0000 at [redacted]w[redacted]d 1. Encounter for supervision of low-risk first pregnancy in second trimester -Will leave urine sample tomorrow to check for UTI, order placed and appt scheduled for 3 pm.  -Given anticipatory guidance for 2 hour gtt in 5-6 weeks -Recommended colace, fiber for constipation.  -Reviewed signs of pyelo/what to do  Preterm labor symptoms and general obstetric precautions including but not limited to vaginal bleeding, contractions, leaking of fluid and fetal movement were reviewed in detail with the patient.  Return in about 6 weeks (around 05/14/2020), or LROB in person and 2 hour GTT.  Future Appointments  Date Time Provider [place]  04/03/2020  3:00 PM [facility] NURSE [facility]     Time spent on virtual visit: 15 minutes  Starr Lake, CNM

## 2020-04-03 ENCOUNTER — Encounter: Payer: Self-pay | Admitting: *Deleted

## 2020-04-03 ENCOUNTER — Ambulatory Visit (INDEPENDENT_AMBULATORY_CARE_PROVIDER_SITE_OTHER): Payer: BC Managed Care – PPO | Admitting: *Deleted

## 2020-04-03 ENCOUNTER — Other Ambulatory Visit: Payer: Self-pay

## 2020-04-03 VITALS — BP 94/49 | HR 55 | Ht 63.0 in | Wt 150.2 lb

## 2020-04-03 DIAGNOSIS — Z87448 Personal history of other diseases of urinary system: Secondary | ICD-10-CM | POA: Diagnosis not present

## 2020-04-03 DIAGNOSIS — Z3402 Encounter for supervision of normal first pregnancy, second trimester: Secondary | ICD-10-CM

## 2020-04-03 DIAGNOSIS — R300 Dysuria: Secondary | ICD-10-CM | POA: Diagnosis not present

## 2020-04-03 LAB — POCT URINALYSIS DIP (DEVICE)
Bilirubin Urine: NEGATIVE
Glucose, UA: NEGATIVE mg/dL
Hgb urine dipstick: NEGATIVE
Ketones, ur: NEGATIVE mg/dL
Leukocytes,Ua: NEGATIVE
Nitrite: NEGATIVE
Protein, ur: NEGATIVE mg/dL
Specific Gravity, Urine: >=1.03 (ref 1.005–1.030)
Urobilinogen, UA: 0.2 mg/dL (ref 0.0–1.0)
pH: 6 (ref 5.0–8.0)

## 2020-04-03 NOTE — Progress Notes (Signed)
Pt reports burning with urination x3 days however has now stopped. She also previously had Lt flank pain which has stopped. Pt discussed her symptoms during video visit yesterday with Maye Hides and was advised to submit urine specimen today. CCAU was obtained and was not indicative of UTI. Due to pt's history of pyelonephritis, urine culture was sent to lab. Pt will be notified of results via [place]. She was advised to continue increased fluids by mouth. Pt voiced understanding of all information and instructions given.

## 2020-04-05 LAB — CULTURE, OB URINE

## 2020-04-05 LAB — URINE CULTURE, OB REFLEX

## 2020-04-09 NOTE — Progress Notes (Signed)
Chart reviewed for nurse visit. Agree with plan of care.   Starr Lake, [place] 04/09/2020 5:37 PM

## 2020-05-13 ENCOUNTER — Other Ambulatory Visit: Payer: Self-pay | Admitting: *Deleted

## 2020-05-13 DIAGNOSIS — Z3402 Encounter for supervision of normal first pregnancy, second trimester: Secondary | ICD-10-CM

## 2020-05-15 ENCOUNTER — Other Ambulatory Visit: Payer: Self-pay

## 2020-05-15 ENCOUNTER — Encounter: Payer: Self-pay | Admitting: Medical

## 2020-05-15 ENCOUNTER — Ambulatory Visit (INDEPENDENT_AMBULATORY_CARE_PROVIDER_SITE_OTHER): Payer: BC Managed Care – PPO | Admitting: Medical

## 2020-05-15 ENCOUNTER — Other Ambulatory Visit: Payer: BC Managed Care – PPO

## 2020-05-15 VITALS — BP 118/64 | HR 60 | Wt 162.0 lb

## 2020-05-15 DIAGNOSIS — Z3402 Encounter for supervision of normal first pregnancy, second trimester: Secondary | ICD-10-CM

## 2020-05-15 DIAGNOSIS — Z23 Encounter for immunization: Secondary | ICD-10-CM | POA: Diagnosis not present

## 2020-05-15 DIAGNOSIS — Z3403 Encounter for supervision of normal first pregnancy, third trimester: Secondary | ICD-10-CM

## 2020-05-15 DIAGNOSIS — Z3A27 27 weeks gestation of pregnancy: Secondary | ICD-10-CM

## 2020-05-15 NOTE — Progress Notes (Signed)
   PRENATAL VISIT NOTE  Subjective:  Shannon Lyons is a 24 y.o. G1P0000 at [redacted]w[redacted]d being seen today for ongoing prenatal care.  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  Patient reports no complaints.  Contractions: Not present. Vag. Bleeding: None.  Movement: Present. Denies leaking of fluid.   The following portions of the patient's history were reviewed and updated as appropriate: allergies, current medications, past family history, past medical history, past social history, past surgical history and problem list.   Objective:   Vitals:   05/15/20 0900  BP: 118/64  Pulse: 60  Weight: 162 lb (73.5 kg)    Fetal Status: Fetal Heart Rate (bpm): 153 Fundal Height: 28 cm Movement: Present     General:  Alert, oriented and cooperative. Patient is in no acute distress.  Skin: Skin is warm and dry. No rash noted.   Cardiovascular: Normal heart rate noted  Respiratory: Normal respiratory effort, no problems with respiration noted  Abdomen: Soft, gravid, appropriate for gestational age.  Pain/Pressure: Absent     Pelvic: Cervical exam deferred        Extremities: Normal range of motion.  Edema: None  Mental Status: Normal mood and affect. Normal behavior. Normal judgment and thought content.   Assessment and Plan:  Pregnancy: G1P0000 at [redacted]w[redacted]d 1. Encounter for supervision of low-risk first pregnancy in third trimester - 2 hour GTT, CBC, HIV, RPR today  - Patient advised results will be in MyChart - Tdap vaccine greater than or equal to 7yo IM - Declined flu today  - Peds list given again and importance of choosing prior to 36 weeks discussed  - Normal [place] reviewed with patient   2. [redacted] weeks gestation of pregnancy  3. Carpal tunnel, mild, bilateral - Patient notes tingling  in hands, mostly at night - Discussed reasons for carpal tunnel in pregnancy - Advised to try braces if symptoms worsen  - Increase PO hydration   Preterm labor symptoms and general obstetric precautions including but not limited to vaginal bleeding, contractions, leaking of fluid and fetal movement were reviewed in detail with the patient. Please refer to After Visit Summary for other counseling recommendations.   Return in about 2 weeks (around 05/29/2020) for LOB, Virtual.  Future Appointments  Date Time Provider [place]  05/30/2020 10:35 AM [place], Gildardo Cranker, MD [facility]    Kerry Hough, PA-C

## 2020-05-15 NOTE — Patient Instructions (Addendum)
https://www.cdc.gov/vaccines/hcp/vis/vis-statements/tdap.pdf">  Tdap (Tetanus, Diphtheria, Pertussis) Vaccine: What You Need to Know 1. Why get vaccinated? Tdap vaccine can prevent tetanus, diphtheria, and pertussis. Diphtheria and pertussis spread from person to person. Tetanus enters the body through cuts or wounds.  TETANUS (T) causes painful stiffening of the muscles. Tetanus can lead to serious health problems, including being unable to open the mouth, having trouble swallowing and breathing, or death.  DIPHTHERIA (D) can lead to difficulty breathing, heart failure, paralysis, or death.  PERTUSSIS (aP), also known as "whooping cough," can cause uncontrollable, violent coughing which makes it hard to breathe, eat, or drink. Pertussis can be extremely serious in babies and young children, causing pneumonia, convulsions, brain damage, or death. In teens and adults, it can cause weight loss, loss of bladder control, passing out, and rib fractures from severe coughing. 2. Tdap vaccine Tdap is only for children 7 years and older, adolescents, and adults.  Adolescents should receive a single dose of Tdap, preferably at age 53 or 35 years. Pregnant women should get a dose of Tdap during every pregnancy, to protect the newborn from pertussis. Infants are most at risk for severe, life-threatening complications from pertussis. Adults who have never received Tdap should get a dose of Tdap. Also, adults should receive a booster dose every 10 years, or earlier in the case of a severe and dirty wound or burn. Booster doses can be either Tdap or Td (a different vaccine that protects against tetanus and diphtheria but not pertussis). Tdap may be given at the same time as other vaccines. 3. Talk with your health care provider Tell your vaccine provider if the person getting the vaccine:  Has had an allergic reaction after a previous dose of any vaccine that protects against tetanus, diphtheria, or pertussis,  or has any severe, life-threatening allergies.  Has had a coma, decreased level of consciousness, or prolonged seizures within 7 days after a previous dose of any pertussis vaccine (DTP, DTaP, or Tdap).  Has seizures or another nervous system problem.  Has ever had Guillain-Barr Syndrome (also called GBS).  Has had severe pain or swelling after a previous dose of any vaccine that protects against tetanus or diphtheria. In some cases, your health care provider may decide to postpone Tdap vaccination to a future visit.  People with minor illnesses, such as a cold, may be vaccinated. People who are moderately or severely ill should usually wait until they recover before getting Tdap vaccine.  Your health care provider can give you more information. 4. Risks of a vaccine reaction  Pain, redness, or swelling where the shot was given, mild fever, headache, feeling tired, and nausea, vomiting, diarrhea, or stomachache sometimes happen after Tdap vaccine. People sometimes faint after medical procedures, including vaccination. Tell your provider if you feel dizzy or have vision changes or ringing in the ears.  As with any medicine, there is a very remote chance of a vaccine causing a severe allergic reaction, other serious injury, or death. 5. What if there is a serious problem? An allergic reaction could occur after the vaccinated person leaves the clinic. If you see signs of a severe allergic reaction (hives, swelling of the face and throat, difficulty breathing, a fast heartbeat, dizziness, or weakness), call 9-1-1 and get the person to the nearest hospital. For other signs that concern you, call your health care provider.  Adverse reactions should be reported to the Vaccine Adverse Event Reporting System (VAERS). Your health care provider will usually file this report,  or you can do it yourself. Visit the VAERS website at www.vaers.SamedayNews.es or call (484) 477-1115. VAERS is only for reporting  reactions, and VAERS staff do not give medical advice. 6. The National Vaccine Injury Compensation Program The [employer] Vaccine Injury Compensation Program (VICP) is a federal program that was created to compensate people who may have been injured by certain vaccines. Visit the VICP website at GoldCloset.com.ee or call 984 089 8194 to learn about the program and about filing a claim. There is a time limit to file a claim for compensation. 7. How can I learn more?  Ask your health care provider.  Call your local or state health department.  Contact the Centers for Disease Control and Prevention (CDC): ? Call 669 476 2133 (1-800-CDC-INFO) or ? Visit CDC's website at http://hunter.com/ Vaccine Information Statement Tdap (Tetanus, Diphtheria, Pertussis) Vaccine (08/23/2018) This information is not intended to replace advice given to you by your health care provider. Make sure you discuss any questions you have with your health care provider. Document Revised: 09/01/2018 Document Reviewed: 09/04/2018 Elsevier Patient Education  2020 [place] [phone]) . [facility] Davy Pique, MD; Gwendlyn Deutscher, MD; Walker Kehr, MD; Andria Frames, MD; McDiarmid, MD; Dutch Quint, MD; Nori Riis, MD; Mingo Amber, [address] [place], [place] [address] o [phone] o Mon-Fri 8:30-12:30, 1:30-5:00 o Providers come to see babies at [facility] o Accepting Medicaid . [place] at [place] providers who accept newborns: Dorthy Cooler, MD; Orland Mustard, MD; Stephanie Acre, MD o [place], [place], [place] [address] o [phone] o Mon-Fri 8:00-5:30 o Babies seen by providers at [facility] o Does NOT accept Medicaid o Please call early in hospitalization for appointment (limited availability)  . Mustard [place], MD o [address], [place],  [place] [address] o [phone] o Mon, Tue, Thur, Fri 8:30-5:00, Wed 10:00-7:00 (closed 1-2pm) o Babies seen by [facility] providers o Accepting Medicaid . [place], MD o [place], [place], [place] [address] o [phone] o Mon-Fri 8:30-5:00, Sat 8:30-12:00 o Provider comes to see babies at [place] Medicaid o Must have been referred from current patients or contacted office prior to delivery . [facility] ([place] for [place]) Franne Forts, MD; Tamera Punt, MD; Doneen Poisson, MD; Fatima Sanger, MD; Wynetta Emery, MD; Jess Barters, MD; Tami Ribas, MD; Herbert Moors, MD; Derrell Lolling, MD; Dorothyann Peng, MD; Lucious Groves, NP; Baldo Ash, NP o [address], [place], [place] [address] o [phone] o Mon, Tue, Thur, Fri 8:30-5:30, Wed 9:30-5:30, Sat 8:30-12:30 o Babies seen by [facility] providers o Accepting Medicaid o Only accepting infants of first-time parents or siblings of current patients [facility] discharge coordinator will make follow-up appointment . Baltazar Najjar o [place] [address] [place], [place]  [address] o [phone]   Fax - [phone] . [facility] o [id number] N. [address], [place], [place]  [address] o Phone - [phone]   Fax [phone] . [place], [place], [place], [place]  [address] o [phone]  [place] [phone]) . [facility] Reginal Lutes, MD; Jacklynn Ganong, MD; Torrie Mayers, MD; MD; Rosana Hoes, MD; Servando Salina, MD; Rose Fillers, MD; Rex Kras, MD; Corinna Capra, MD; Volney American, MD; Trilby Drummer, MD; Janann Colonel, MD; Jimmye Norman, [place], [place], [place] [address] o [phone] o Mon-Fri 8:30-5:00 (extended evenings Mon-Thur as needed), Sat-Sun 10:00-1:00 o Providers come to see babies at  [facility] o Accepting Medicaid for families of first-time babies and families with all children in the household age 68 and under. Must register with office prior to making  appointment (M-F only). . [place], NP; Tomi Bamberger, MD; Redmond School, MD; [place], [place]., [place], [place] [address] o [phone] o Mon-Fri 8:00-5:00 o Babies seen by providers at [facility] o Does NOT accept Medicaid/Commercial Insurance Only . [facility] [place] ([facility])  Marnee Guarneri, MD; Drema Dallas, MD; Montine Circle, MD; Vilma Prader, MD; Vanita Panda, MD; Alfonso Ramus, MD; Ruthann Cancer, MD; Roxanne Mins, MD; Rosalva Ferron, MD; Polly Cobia, MD o [address]., [place], [place] [address] o [phone] o Mon-Fri 8:30-5:30, Sat (Oct.-Mar.) 9:00-1:00 o Babies seen by providers at [place] [phone]) . [facility] Peers, MD; Suzan Slick, MD o [address], [place], [place] [address] o [phone] o Mon-Fri 8:30-5:00, Sat 8:30-12:00 o Providers come to see babies at [facility] o Does NOT accept Medicaid . [place] at [place], [place]; [place], MD; [place], [place]; Nancy Fetter, MD; Moreen Fowler, [place], [place], [place] [address] o [phone] o Mon-Fri 8:00-5:00 o Babies seen by providers at [facility] o Does NOT accept Medicaid o Only accepting babies of parents who are patients o Please call early in hospitalization for appointment (limited availability) . [facility] Blanca Friend, MD; Sharlene Motts, MD; Rod Can, MD; Warner Mccreedy, NP; Sabra Heck, MD; Ermalinda Memos, MD; Sharlett Iles, NP; Aurther Loft, MD; Jerrye Beavers, MD; Marcello Moores, MD; Berline Lopes, MD; Charolette Forward, MD o [place] [place], [place], [place] [address] o [phone] o Mon-Fri 8:00-5:00, Sat 9:00-12:00 o Providers come to see babies at [facility] o Does NOT accept [facility] [phone]) . [place] at [place] providers accepting new patients: Dayna Ramus, NP; [place], [place], [place], [place] [address] o [phone] o Mon-Fri  8:00-5:00 o Babies seen by providers at [facility] o Does NOT accept Medicaid o Only accepting babies of parents who are patients o Please call early in hospitalization for appointment (limited availability) . [facility] Oswaldo Conroy, MD; Sheran Lawless, MD o [address] [place], [place] [address] o [phone] (press 1 to schedule appointment) o Mon-Fri 8:00-5:00 o Providers come to see babies at [facility] o Does NOT accept Medicaid . [facility] Jodi Mourning, MD o [address] [place], [place] [address] o [phone] o Mon-Fri 8:30-5:00 (lunch 12:30-1:00), extended hours by appointment only Wed 5:00-6:30 o Babies seen by [facility] providers o Accepting Medicaid . [place] at Evalyn Casco, MD; [place], MD; Ethlyn Gallery, MD o [place], [place], [place] [address] o [phone] o Mon-Fri 8:00-5:00 o Babies seen by [facility] providers o Does NOT accept Medicaid . Therapist, music at [place], MD; Yong Channel, MD; [place], [place]., [place], [place] [address] o [phone] o Mon-Fri 8:00-5:00 o Babies seen by [facility] providers o Does NOT accept Medicaid . [place], [place]; [place], [place]; [place], NP; Albertina Parr, MD; Frederic Jericho, MD; Ronney Lion, MD; Carlos Levering, NP; Jerelene Redden, NP; Tomasita Crumble, NP; Ronelle Nigh, NP; Corinna Lines, MD; [place], MD o [address]., [place],  [address] o [phone] o Mon-Fri 8:30-5:00, Sat 10:00-1:00 o Providers come to see babies at [facility] o Does NOT accept Medicaid o Free prenatal information session Tuesdays at 4:45pm . [facility] Porfirio Oar, MD; [place], [place];  Jacqulynn Cadet, [place]; [place], [place] [place] [address] o [phone] o Mon-Fri 7:30-5:30 o Babies seen by [facility] providers . [facility] o [address], [place], [place], [place]  [address] o [phone]   Fax -  [phone]  [place] [phone] & [phone]) . [place], MD o [address] [place], [place] [address] o [phone] o Mon-Thur 8:00-6:00 o Providers come to see babies at [place] Medicaid . [place], NP; Melford Aase, MD; [place], [place]; [place], [address]., [place], [place] [address] o [phone] o Mon-Thur 7:30-7:30, Fri 7:30-4:30 o Babies seen by [facility] providers o Accepting Medicaid . [facility] Nyra Jabs, MD; Cristino Martes, NP; Gertie Baron, MD o [place] [address], [place], [place] [address] o [phone] o Mon-Fri 8:30-5:00, Sat 8:30-12:00 o Providers come to see babies at [place] Medicaid o Must have "Meet & Greet" appointment at office prior to delivery . [place] ([place]) Jodene Nam, MD; Juleen China, MD; Clydene Laming, [place] [address], [place], [place] [address] o [phone] o Mon-Wed 8:00-6:00, Thur-Fri 8:00-5:00, Sat 9:00-12:00 o Providers come to see babies at [facility] o Does NOT accept Medicaid o Only accepting siblings of current patients . [facility], [place], [place], [place]  [address] o [phone]   Fax [phone] . [place] at [place] [address], [place], [place]  [address] o [phone]   Fax - Ashe Qulin [phone] & [phone]) . Therapist, music at [place], DO; [place], [address] [place], [place] [address] o [phone] o Mon-Fri 7:00-5:00 o Babies seen by [facility] providers o Does NOT accept Medicaid . [place], MD; [place], [place]; [place], [place], [place], [place] [address] o [phone] o Mon-Fri 8:00-5:00 o Babies seen by [facility] providers o Accepting Medicaid . [place], MD; [place], [place]; [place], NP; [place], [place], [place], [place] [address] o [phone] o Mon-Fri 8:00-5:00 o Babies seen by providers at [place] [phone]) . [facility] [place], [place] o [address]., [place], [place] [address] o [phone] o Mon-Fri 8:00-5:00 o Babies seen by [facility] providers o Does NOT accept Medicaid o Limited availability, please call early in hospitalization to schedule follow-up . [facility] Leilani Merl, PA; Maisie Fus, MD; [place], MD; [place], [place]; Jeannine Kitten, MD; [place], [facility] [facility] [address], [place], [place] [address] o [phone] o Mon-Fri 8:30-5:00, Sat 9:00-12:00 o Babies seen by providers at [facility] o Accepting Medicaid o Please register online then schedule online or call office o [email or web address] . [place] ([place] at [place]) Kristian Covey, NP; Dwyane Dee, MD; Leonidas Romberg, PA o [address] [place], [place], [place] [address] o [phone] o Mon-Fri 8:00-5:00 o Babies seen by providers at [facility] o Accepting Medicaid . [place] ([facility] [employer]) Dairl Ponder, MD; Rayvon Char, NP; Melina Modena, MD o [address] [place], [place], [place] [address] o [phone] o Mon-Fri 8:00-5:30, Sat&Sun by appointment (phones open at 8:30) o Babies  seen by [facility] providers o Accepting Medicaid o Must be a first-time baby or sibling of current patient . [place], [id number], [place], [place]  [address] o [phone]   Fax - [phone]  [place] [phone] & [phone]) . [place], [place]; [place], [place]; Benjamine Mola, MD; [place], [place]; Harrell Lark, MD o [address], [place], [place] [address] o [phone] o Mon-Thur 8:00-7:00, Fri 8:00-5:00, Sat 8:00-12:00, Sun 9:00-12:00 o Babies seen by [facility] providers o Accepting Medicaid . [facility] at [facility], MD; Ruthann Cancer, MD; [facility], MD o [address], [place], [place] [address] o [phone] o Mon-Thur 8:00-5:00 o Babies seen by providers at [facility] o Accepting Medicaid . [facility] at [place], MD; [place], MD; Amedeo Plenty, MD; Bobby Rumpf, MD; [place], MD; Lavonia Drafts, MD; Ruthann Cancer, MD; Selinda Eon, MD; Audie Box, MD; Jim Like, MD; Christie Nottingham, MD; Hubbard Hartshorn, MD; [place]ena Nunnery, MD o [address] [place], [place] [address] o [phone] o Mon-Fri 8:00-5:30, Sat (Oct.-Mar.) 9:00-1:00 o Babies seen by providers at [facility] o Accepting Medicaid o Must fill out new patient packet, available online at [email or web address] . [place] ([facility]) Barnabas Lister, NP; Kenton Kingfisher, NP; Claiborne Billings, NP; Rolla Plate, MD; [place], [place]; Carola Rhine, MD; Tyron Russell, MD; Delia Chimes, NP o [address], [place], [place] [address] o [phone] o Mon-Thur 8:00-5:30, Fri 8:00-5:00 o Babies seen by providers at [place] [phone]) . [place], [place]; Wallace, MD; Dennard Schaumann, MD; [place], [place] o [address] [place], [place] [address] o [phone] o Mon-Fri 8:00-5:00 o Babies seen by providers at [place] [phone]) . [place] at [place], [place]; Olen Pel, MD; [place], [place], [place], [place] [address] o [phone] o Mon-Fri 8:00-5:00 o Babies seen by providers at [facility] o Does NOT accept Medicaid o Limited appointment availability, please call early in hospitalization  . Therapist, music at [place], [place]; [place], [place] [address] [place], [place] [address] o [phone] o Mon-Fri 8:00-5:00 o Babies seen by [facility] providers o Does NOT accept Medicaid . [facility] Su Grand, MD; Guy Sandifer, MD; [place], [place]; [place], [place] [address], [place], [place] [address] o [phone] o Mon-Fri 8:00-5:00 o After hours clinic [facility] [place], [place] [address]) [phone] Mon-Fri 5:00-8:00, Sat 12:00-6:00, Sun 10:00-4:00 o Babies seen by [facility] providers o Accepting Medicaid . [place] [facility] o [address] [place], [place]  [address] o [phone]   Fax - [phone]  [place] [phone]) . Therapist, music at [facility], MD o [address] [place], [place], [place] [address] o [phone] o Mon-Fri 8:00-5:00 o Babies seen by [facility] providers o Does NOT accept Medicaid . [place] ([place] at [place]) Bing Neighbors, MD o [address] [place], [place], [place] [address] o [phone] o Mon-Thur 8:00-7:00, Fri 8:00-5:00, Sat 8:00-12:00 o Babies seen by providers at [facility] o Accepting Medicaid - but does not have vaccinations in office (must be received elsewhere) o Limited availability, please call early in hospitalization  [place] [phone]) . [place], [place] [address] [place] [address] o [phone]  Fax [phone]

## 2020-05-16 LAB — CBC
Hematocrit: 35.4 % (ref 34.0–46.6)
Hemoglobin: 12.1 g/dL (ref 11.1–15.9)
MCH: 30 pg (ref 26.6–33.0)
MCHC: 34.2 g/dL (ref 31.5–35.7)
MCV: 88 fL (ref 79–97)
Platelets: 218 10*3/uL (ref 150–450)
RBC: 4.04 x10E6/uL (ref 3.77–5.28)
RDW: 12.7 % (ref 11.7–15.4)
WBC: 9 10*3/uL (ref 3.4–10.8)

## 2020-05-16 LAB — GLUCOSE TOLERANCE, 2 HOURS W/ 1HR
Glucose, 1 hour: 134 mg/dL (ref 65–179)
Glucose, 2 hour: 112 mg/dL (ref 65–152)
Glucose, Fasting: 86 mg/dL (ref 65–91)

## 2020-05-16 LAB — RPR: RPR Ser Ql: NONREACTIVE

## 2020-05-16 LAB — HIV ANTIBODY (ROUTINE TESTING W REFLEX): HIV Screen 4th Generation wRfx: NONREACTIVE

## 2020-05-24 NOTE — L&D Delivery Note (Addendum)
OB Delivery Note Shannon Lyons is a 25 y.o. s/p vaginal delivery at [redacted]w[redacted]d.  She was admitted for IOL in the setting of gHTN.    ROM: 12h 66m with moderate meconium stained fluid GBS Status: Negative Maximum Maternal Temperature: 99.45F   Labor Progress: Pt presented for IOL due to gHTN. She received cytotec x4 for cervical ripening and then pitocin was initiated. Patient progressed well and had SROM with moderate meconium stained fluid. She was then noted to have complete cervical dilation and delivered as noted below.   Delivery Date/Time: 08/01/2020 at 0550 Delivery: Called to room and patient was complete and pushing. Head delivered LOA. No nuchal cord present. Mild shoulder dystocia lasting 30 seconds but resolved with McRobert's. Compound hand presentation noted, so arm was swept and shoulders delivery easily. Infant dried and stimulated with subsequent spontaneous cry, placed on mother's abdomen. Cord clamped x 2 after 1-minute delay, and cut by FOB under my direct supervision. Cord blood drawn. Placenta delivered spontaneously with gentle cord traction. Fundus firm with massage and Pitocin. 82mcg rectal cytotec given for brisk bleeding. Labia, perineum, vagina, and cervix were inspected; very small 1st degree perineal laceration noted and repaired as below.   Placenta: intact, 3-vessel cord, sent to L&D Complications: postpartum hemorrhage (EBL 1000 mL), resolved with 854mcg rectal cytotec, fundal massage, and delivery of placenta. Lacerations: very small 1st degree perineal, repaired with 3-0 vicryl rapide EBL: 1000 mL Analgesia: epidural   Infant: female  APGARs 7 & 9  weight per medical record  Alcus Dad, MD  Attestation:  I confirm that I have verified the information documented in the resident's note and that I have also personally reperformed the physical exam and all medical decision making activities.   I was gloved and present for entire delivery SVD without  incident Mild shoulder dystocia resulting from malrestitution of shoulders.  Baby delivered LOA but shoulders rotated left instead of to the right. In addition, there was a compound hand/arm which required sweeping to deliver, then shoulders came easily.  Total time about 30 seconds Lacerations as listed above Repair of same supervised by me  Seabron Spates, CNM

## 2020-05-30 ENCOUNTER — Encounter: Payer: Self-pay | Admitting: Obstetrics and Gynecology

## 2020-05-30 ENCOUNTER — Telehealth (INDEPENDENT_AMBULATORY_CARE_PROVIDER_SITE_OTHER): Payer: BC Managed Care – PPO | Admitting: Obstetrics and Gynecology

## 2020-05-30 VITALS — BP 137/77 | HR 74

## 2020-05-30 DIAGNOSIS — C562 Malignant neoplasm of left ovary: Secondary | ICD-10-CM

## 2020-05-30 DIAGNOSIS — Z3403 Encounter for supervision of normal first pregnancy, third trimester: Secondary | ICD-10-CM

## 2020-05-30 DIAGNOSIS — Z9079 Acquired absence of other genital organ(s): Secondary | ICD-10-CM

## 2020-05-30 DIAGNOSIS — Z90721 Acquired absence of ovaries, unilateral: Secondary | ICD-10-CM

## 2020-05-30 DIAGNOSIS — Z3A29 29 weeks gestation of pregnancy: Secondary | ICD-10-CM

## 2020-05-30 NOTE — Progress Notes (Signed)
   TELEHEALTH OBSTETRICS VISIT ENCOUNTER NOTE  Provider location: [facility] [employer] [facility].  I connected with Shannon Lyons on 05/30/20 at 10:35 AM EST by telephone at home and verified that I am speaking with the correct person using two identifiers.    I discussed the limitations, risks, security and privacy concerns of performing an evaluation and management service by telephone and the availability of in person appointments. I also discussed with the patient that there may be a patient responsible charge related to this service. The patient expressed understanding and agreed to proceed.  Subjective:  Shannon Lyons is a 25 y.o. G1P0000 at [redacted]w[redacted]d being followed for ongoing prenatal care.  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  Patient reports no complaints. Reports fetal movement. Denies any contractions, bleeding or leaking of fluid.   The following portions of the patient's history were reviewed and updated as appropriate: allergies, current medications, past family history, past medical history, past social history, past surgical history and problem list.   Objective:   General:  Alert, oriented and cooperative.   Mental Status: Normal mood and affect perceived. Normal judgment and thought content.  Rest of physical exam deferred due to type of encounter  Assessment and Plan:  Pregnancy: G1P0000 at [redacted]w[redacted]d 1. [redacted] weeks gestation of pregnancy, Encounter for supervision of low-risk first pregnancy in third trimester: No red flag signs/symptoms today. Home BP wnl today. No specific questions/concerns. Pt endorses fetal movement. Anatomy scan at [redacted]w[redacted]d showed normal AFI and normal growth with EFW 345g, 63%ile. S/p Tdap  vaccine. - counseled on flu and covid vaccines today (plan for flu vaccine at f/u prenatal appt) - continue prenatal vitamin daily - strict return precautions for decreased fetal movement, vaginal bleeding, preterm labor & preeclampsia signs/symptoms - rtc in 2 weeks for f/u in-person prenatal appt  2. 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  Preterm labor symptoms and general obstetric precautions including but not limited to vaginal bleeding, contractions, leaking of fluid and fetal movement were reviewed in detail with the patient.   I discussed the assessment and treatment plan with the patient. The patient was provided an opportunity to ask questions and all were answered. The patient agreed with the plan and demonstrated an understanding of the instructions. The patient was advised to call back or seek an in-person office evaluation/go to [facility] at [facility] for any urgent or concerning symptoms. Please refer to After Visit Summary for other counseling recommendations.   I provided 15 minutes of non-face-to-face time during this encounter.  Return in about 2 weeks (around 06/13/2020) for f/u in person prenatal appt any provider.  Future Appointments  Date Time Provider [place]  06/13/2020  8:35 AM Hillard Danker, Ricarda Frame [facility]   Randa Ngo, MD OB Fellow, [facility] 05/30/2020 1:34 PM

## 2020-05-30 NOTE — Progress Notes (Signed)
I connected with  [name] on 05/30/20 at 10:35 AM EST by telephone and verified that I am speaking with the correct person using two identifiers.   I discussed the limitations, risks, security and privacy concerns of performing an evaluation and management service by telephone and the availability of in person appointments. I also discussed with the patient that there may be a patient responsible charge related to this service. The patient expressed understanding and agreed to proceed.  Georgia Lopes, RN 05/30/2020  11:13 AM

## 2020-05-30 NOTE — Patient Instructions (Addendum)
Third Trimester of Pregnancy The third trimester is from week 28 through week 40 (months 7 through 9). The third trimester is a time when the unborn baby (fetus) is growing rapidly. At the end of the ninth month, the fetus is about 20 inches in length and weighs 6-10 pounds. Body changes during your third trimester Your body will continue to go through many changes during pregnancy. The changes vary from woman to woman. During the third trimester:  Your weight will continue to increase. You can expect to gain 25-35 pounds (11-16 kg) by the end of the pregnancy.  You may begin to get stretch marks on your hips, abdomen, and breasts.  You may urinate more often because the fetus is moving lower into your pelvis and pressing on your bladder.  You may develop or continue to have heartburn. This is caused by increased hormones that slow down muscles in the digestive tract.  You may develop or continue to have constipation because increased hormones slow digestion and cause the muscles that push waste through your intestines to relax.  You may develop hemorrhoids. These are swollen veins (varicose veins) in the rectum that can itch or be painful.  You may develop swollen, bulging veins (varicose veins) in your legs.  You may have increased body aches in the pelvis, back, or thighs. This is due to weight gain and increased hormones that are relaxing your joints.  You may have changes in your hair. These can include thickening of your hair, rapid growth, and changes in texture. Some women also have hair loss during or after pregnancy, or hair that feels dry or thin. Your hair will most likely return to normal after your baby is born.  Your breasts will continue to grow and they will continue to become tender. A yellow fluid (colostrum) may leak from your breasts. This is the first milk you are producing for your baby.  Your belly button may stick out.  You may notice more swelling in your hands,  face, or ankles.  You may have increased tingling or numbness in your hands, arms, and legs. The skin on your belly may also feel numb.  You may feel short of breath because of your expanding uterus.  You may have more problems sleeping. This can be caused by the size of your belly, increased need to urinate, and an increase in your body's metabolism.  You may notice the fetus "dropping," or moving lower in your abdomen (lightening).  You may have increased vaginal discharge.  You may notice your joints feel loose and you may have pain around your pelvic bone. What to expect at prenatal visits You will have prenatal exams every 2 weeks until week 36. Then you will have weekly prenatal exams. During a routine prenatal visit:  You will be weighed to make sure you and the baby are growing normally.  Your blood pressure will be taken.  Your abdomen will be measured to track your baby's growth.  The fetal heartbeat will be listened to.  Any test results from the previous visit will be discussed.  You may have a cervical check near your due date to see if your cervix has softened or thinned (effaced).  You will be tested for Group B streptococcus. This happens between 35 and 37 weeks. Your health care provider may ask you:  What your birth plan is.  How you are feeling.  If you are feeling the baby move.  If you have had any abnormal   symptoms, such as leaking fluid, bleeding, severe headaches, or abdominal cramping.  If you are using any tobacco products, including cigarettes, chewing tobacco, and electronic cigarettes.  If you have any questions. Other tests or screenings that may be performed during your third trimester include:  Blood tests that check for low iron levels (anemia).  Fetal testing to check the health, activity level, and growth of the fetus. Testing is done if you have certain medical conditions or if there are problems during the pregnancy.  Nonstress test  (NST). This test checks the health of your baby to make sure there are no signs of problems, such as the baby not getting enough oxygen. During this test, a belt is placed around your belly. The baby is made to move, and its heart rate is monitored during movement. What is false labor? False labor is a condition in which you feel small, irregular tightenings of the muscles in the womb (contractions) that usually go away with rest, changing position, or drinking water. These are called Braxton Hicks contractions. Contractions may last for hours, days, or even weeks before true labor sets in. If contractions come at regular intervals, become more frequent, increase in intensity, or become painful, you should see your health care provider. What are the signs of labor?  Abdominal cramps.  Regular contractions that start at 10 minutes apart and become stronger and more frequent with time.  Contractions that start on the top of the uterus and spread down to the lower abdomen and back.  Increased pelvic pressure and dull back pain.  A watery or bloody mucus discharge that comes from the vagina.  Leaking of amniotic fluid. This is also known as your "water breaking." It could be a slow trickle or a gush. Let your health care provider know if it has a color or strange odor. If you have any of these signs, call your health care provider right away, even if it is before your due date. Follow these instructions at home: Medicines  Follow your health care provider's instructions regarding medicine use. Specific medicines may be either safe or unsafe to take during pregnancy.  Take a prenatal vitamin that contains at least 600 micrograms (mcg) of folic acid.  If you develop constipation, try taking a stool softener if your health care provider approves. Eating and drinking   Eat a balanced diet that includes fresh fruits and vegetables, whole grains, good sources of protein such as meat, eggs, or tofu,  and low-fat dairy. Your health care provider will help you determine the amount of weight gain that is right for you.  Avoid raw meat and uncooked cheese. These carry germs that can cause birth defects in the baby.  If you have low calcium intake from food, talk to your health care provider about whether you should take a daily calcium supplement.  Eat four or five small meals rather than three large meals a day.  Limit foods that are high in fat and processed sugars, such as fried and sweet foods.  To prevent constipation: ? Drink enough fluid to keep your urine clear or pale yellow. ? Eat foods that are high in fiber, such as fresh fruits and vegetables, whole grains, and beans. Activity  Exercise only as directed by your health care provider. Most women can continue their usual exercise routine during pregnancy. Try to exercise for 30 minutes at least 5 days a week. Stop exercising if you experience uterine contractions.  Avoid heavy lifting.  Do   not exercise in extreme heat or humidity, or at high altitudes.  Wear low-heel, comfortable shoes.  Practice good posture.  You may continue to have sex unless your health care provider tells you otherwise. Relieving pain and discomfort  Take frequent breaks and rest with your legs elevated if you have leg cramps or low back pain.  Take warm sitz baths to soothe any pain or discomfort caused by hemorrhoids. Use hemorrhoid cream if your health care provider approves.  Wear a good support bra to prevent discomfort from breast tenderness.  If you develop varicose veins: ? Wear support pantyhose or compression stockings as told by your healthcare provider. ? Elevate your feet for 15 minutes, 3-4 times a day. Prenatal care  Write down your questions. Take them to your prenatal visits.  Keep all your prenatal visits as told by your health care provider. This is important. Safety  Wear your seat belt at all times when driving.  Make  a list of emergency phone numbers, including numbers for family, friends, the hospital, and police and fire departments. General instructions  Avoid cat litter boxes and soil used by cats. These carry germs that can cause birth defects in the baby. If you have a cat, ask someone to clean the litter box for you.  Do not travel far distances unless it is absolutely necessary and only with the approval of your health care provider.  Do not use hot tubs, steam rooms, or saunas.  Do not drink alcohol.  Do not use any products that contain nicotine or tobacco, such as cigarettes and e-cigarettes. If you need help quitting, ask your health care provider.  Do not use any medicinal herbs or unprescribed drugs. These chemicals affect the formation and growth of the baby.  Do not douche or use tampons or scented sanitary pads.  Do not cross your legs for long periods of time.  To prepare for the arrival of your baby: ? Take prenatal classes to understand, practice, and ask questions about labor and delivery. ? Make a trial run to the hospital. ? Visit the hospital and tour the maternity area. ? Arrange for maternity or paternity leave through employers. ? Arrange for family and friends to take care of pets while you are in the hospital. ? Purchase a rear-facing car seat and make sure you know how to install it in your car. ? Pack your hospital bag. ? Prepare the baby's nursery. Make sure to remove all pillows and stuffed animals from the baby's crib to prevent suffocation.  Visit your dentist if you have not gone during your pregnancy. Use a soft toothbrush to brush your teeth and be gentle when you floss. Contact a health care provider if:  You are unsure if you are in labor or if your water has broken.  You become dizzy.  You have mild pelvic cramps, pelvic pressure, or nagging pain in your abdominal area.  You have lower back pain.  You have persistent nausea, vomiting, or  diarrhea.  You have an unusual or bad smelling vaginal discharge.  You have pain when you urinate. Get help right away if:  Your water breaks before 37 weeks.  You have regular contractions less than 5 minutes apart before 37 weeks.  You have a fever.  You are leaking fluid from your vagina.  You have spotting or bleeding from your vagina.  You have severe abdominal pain or cramping.  You have rapid weight loss or weight gain.  You have   shortness of breath with chest pain.  You notice sudden or extreme swelling of your face, hands, ankles, feet, or legs.  Your baby makes fewer than 10 movements in 2 hours.  You have severe headaches that do not go away when you take medicine.  You have vision changes. Summary  The third trimester is from week 28 through week 40, months 7 through 9. The third trimester is a time when the unborn baby (fetus) is growing rapidly.  During the third trimester, your discomfort may increase as you and your baby continue to gain weight. You may have abdominal, leg, and back pain, sleeping problems, and an increased need to urinate.  During the third trimester your breasts will keep growing and they will continue to become tender. A yellow fluid (colostrum) may leak from your breasts. This is the first milk you are producing for your baby.  False labor is a condition in which you feel small, irregular tightenings of the muscles in the womb (contractions) that eventually go away. These are called Braxton Hicks contractions. Contractions may last for hours, days, or even weeks before true labor sets in.  Signs of labor can include: abdominal cramps; regular contractions that start at 10 minutes apart and become stronger and more frequent with time; watery or bloody mucus discharge that comes from the vagina; increased pelvic pressure and dull back pain; and leaking of amniotic fluid. This information is not intended to replace advice given to you by your  health care provider. Make sure you discuss any questions you have with your health care provider. Document Revised: 08/31/2018 Document Reviewed: 06/15/2016 Elsevier Patient Education  2020 Else[employer] Inc.   Fetal Movement Counts  What is a fetal movement count?  A fetal movement count is the number of times that you feel your baby move during a certain amount of time. This may also be called a fetal kick count. A fetal movement count is recommended for every pregnant woman. You may be asked to start counting fetal movements as early as week 28 of your pregnancy. Pay attention to when your baby is most active. You may notice your baby's sleep and wake cycles. You may also notice things that make your baby move more. You should do a fetal movement count:  When your baby is normally most active.  At the same time each day. A good time to count movements is while you are resting, after having something to eat and drink. How do I count fetal movements? 1. Find a quiet, comfortable area. Sit, or lie down on your side. 2. Write down the date, the start time and stop time, and the number of movements that you felt between those two times. Take this information with you to your health care visits. 3. Write down your start time when you feel the first movement. 4. Count kicks, flutters, swishes, rolls, and jabs. You should feel at least 10 movements. 5. You may stop counting after you have felt 10 movements, or if you have been counting for 2 hours. Write down the stop time. 6. If you do not feel 10 movements in 2 hours, contact your health care provider for further instructions. Your health care provider may want to do additional tests to assess your baby's well-being. Contact a health care provider if:  You feel fewer than 10 movements in 2 hours.  Your baby is not moving like he or she usually does. Date: ____________ Start time: ____________ Stop time: ____________ Movements:    ____________ Date: ____________ Start time: ____________ Stop time: ____________ Movements: ____________ Date: ____________ Start time: ____________ Stop time: ____________ Movements: ____________ Date: ____________ Start time: ____________ Stop time: ____________ Movements: ____________ Date: ____________ Start time: ____________ Stop time: ____________ Movements: ____________ Date: ____________ Start time: ____________ Stop time: ____________ Movements: ____________ Date: ____________ Start time: ____________ Stop time: ____________ Movements: ____________ Date: ____________ Start time: ____________ Stop time: ____________ Movements: ____________ Date: ____________ Start time: ____________ Stop time: ____________ Movements: ____________ This information is not intended to replace advice given to you by your health care provider. Make sure you discuss any questions you have with your health care provider. Document Revised: 12/28/2018 Document Reviewed: 12/28/2018 Elsevier Patient Education  2020 Elsevier Inc.  

## 2020-06-13 ENCOUNTER — Other Ambulatory Visit: Payer: Self-pay

## 2020-06-13 ENCOUNTER — Encounter: Payer: Self-pay | Admitting: Medical

## 2020-06-13 ENCOUNTER — Telehealth (INDEPENDENT_AMBULATORY_CARE_PROVIDER_SITE_OTHER): Payer: BC Managed Care – PPO | Admitting: Medical

## 2020-06-13 VITALS — BP 139/80 | HR 88

## 2020-06-13 DIAGNOSIS — Z9079 Acquired absence of other genital organ(s): Secondary | ICD-10-CM

## 2020-06-13 DIAGNOSIS — Z90721 Acquired absence of ovaries, unilateral: Secondary | ICD-10-CM

## 2020-06-13 DIAGNOSIS — Z3400 Encounter for supervision of normal first pregnancy, unspecified trimester: Secondary | ICD-10-CM

## 2020-06-13 DIAGNOSIS — Z3493 Encounter for supervision of normal pregnancy, unspecified, third trimester: Secondary | ICD-10-CM

## 2020-06-13 DIAGNOSIS — Z3A31 31 weeks gestation of pregnancy: Secondary | ICD-10-CM

## 2020-06-13 NOTE — Progress Notes (Signed)
I connected with Shannon Lyons 06/13/20 at  8:35 AM EST by: MyChart video and verified that I am speaking with the correct person using two identifiers.  Patient is located at home and provider is located at [facility].     The purpose of this virtual visit is to provide medical care while limiting exposure to the novel coronavirus. I discussed the limitations, risks, security and privacy concerns of performing an evaluation and management service by MyChart video and the availability of in person appointments. I also discussed with the patient that there may be a patient responsible charge related to this service. By engaging in this virtual visit, you consent to the provision of healthcare.  Additionally, you authorize for your insurance to be billed for the services provided during this visit.  The patient expressed understanding and agreed to proceed.  The following staff members participated in the virtual visit:  Geralyn Flash, RN    PRENATAL VISIT NOTE  Subjective:  Shannon Lyons is a 25 y.o. G1P0000 at [redacted]w[redacted]d  for phone visit for ongoing prenatal care.  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  Patient reports occasional round ligament pain with change of positions.  Contractions: Not present. Vag. Bleeding: None.  Movement: Present. Denies leaking of fluid.   The following portions of the patient's history were reviewed and updated as appropriate: allergies, current medications, past family history, past medical history, past social history, past surgical history and problem list.   Objective:   Vitals:   06/13/20 0835  BP: 139/80  Pulse: 88   Self-Obtained  Fetal Status:     Movement: Present     Assessment and Plan:  Pregnancy: G1P0000 at [redacted]w[redacted]d 1. Encounter for  supervision of low-risk first pregnancy, antepartum - Doing well - Normal GTT and CBC results discussed  - Anticipatory guidance for future visits reviewed, all questions answered - Patient confirms she has Peds list, importance of choosing Peds prior to 36 weeks discussed   2. History of salpingo-oophorectomy  3. [redacted] weeks gestation of pregnancy  Preterm labor symptoms and general obstetric precautions including but not limited to vaginal bleeding, contractions, leaking of fluid and fetal movement were reviewed in detail with the patient.  Return in about 2 weeks (around 06/27/2020) for LOB, Virtual.  No future appointments.   Time spent on virtual visit: 10 minutes  Kerry Hough, PA-C

## 2020-06-13 NOTE — Patient Instructions (Signed)
Fetal Movement Counts Patient Name: ________________________________________________ Patient Due Date: ____________________  What is a fetal movement count? A fetal movement count is the number of times that you feel your baby move during a certain amount of time. This may also be called a fetal kick count. A fetal movement count is recommended for every pregnant woman. You may be asked to start counting fetal movements as early as week 28 of your pregnancy. Pay attention to when your baby is most active. You may notice your baby's sleep and wake cycles. You may also notice things that make your baby move more. You should do a fetal movement count:  When your baby is normally most active.  At the same time each day. A good time to count movements is while you are resting, after having something to eat and drink. How do I count fetal movements? 1. Find a quiet, comfortable area. Sit, or lie down on your side. 2. Write down the date, the start time and stop time, and the number of movements that you felt between those two times. Take this information with you to your health care visits. 3. Write down your start time when you feel the first movement. 4. Count kicks, flutters, swishes, rolls, and jabs. You should feel at least 10 movements. 5. You may stop counting after you have felt 10 movements, or if you have been counting for 2 hours. Write down the stop time. 6. If you do not feel 10 movements in 2 hours, contact your health care provider for further instructions. Your health care provider may want to do additional tests to assess your baby's well-being. Contact a health care provider if:  You feel fewer than 10 movements in 2 hours.  Your baby is not moving like he or she usually does. Date: ____________ Start time: ____________ Stop time: ____________ Movements: ____________ Date: ____________ Start time: ____________ Stop time: ____________ Movements: ____________ Date: ____________  Start time: ____________ Stop time: ____________ Movements: ____________ Date: ____________ Start time: ____________ Stop time: ____________ Movements: ____________ Date: ____________ Start time: ____________ Stop time: ____________ Movements: ____________ Date: ____________ Start time: ____________ Stop time: ____________ Movements: ____________ Date: ____________ Start time: ____________ Stop time: ____________ Movements: ____________ Date: ____________ Start time: ____________ Stop time: ____________ Movements: ____________ Date: ____________ Start time: ____________ Stop time: ____________ Movements: ____________ This information is not intended to replace advice given to you by your health care provider. Make sure you discuss any questions you have with your health care provider. Document Revised: 12/28/2018 Document Reviewed: 12/28/2018 Elsevier Patient Education  [place]. Rosen's Emergency Medicine: Concepts and Clinical Practice (9th ed., pp. 2296- 2312). Elsevier.">  Braxton Hicks Contractions Contractions of the uterus can occur throughout pregnancy, but they are not always a sign that you are in labor. You may have practice contractions called Braxton Hicks contractions. These false labor contractions are sometimes confused with true labor. What are [place] contractions? Braxton Hicks contractions are tightening movements that occur in the muscles of the uterus before labor. Unlike true labor contractions, these contractions do not result in opening (dilation) and thinning of the cervix. Toward the end of pregnancy (32-34 weeks), Braxton Hicks contractions can happen more often and may become stronger. These contractions are sometimes difficult to tell apart from true labor because they can be very uncomfortable. You should not feel embarrassed if you go to the hospital with false labor. Sometimes, the only way to tell if you are in true labor is for your  for your health care  provider to look for changes in the cervix. The health care provider will do a physical exam and may monitor your contractions. If you are not in true labor, the exam should show that your cervix is not dilating and your water has not broken. If there are no other health problems associated with your pregnancy, it is completely safe for you to be sent home with false labor. You may continue to have Braxton Hicks contractions until you go into true labor. How to tell the difference between true labor and false labor True labor  Contractions last 30-70 seconds.  Contractions become very regular.  Discomfort is usually felt in the top of the uterus, and it spreads to the lower abdomen and low back.  Contractions do not go away with walking.  Contractions usually become more intense and increase in frequency.  The cervix dilates and gets thinner. False labor  Contractions are usually shorter and not as strong as true labor contractions.  Contractions are usually irregular.  Contractions are often felt in the front of the lower abdomen and in the groin.  Contractions may go away when you walk around or change positions while lying down.  Contractions get weaker and are shorter-lasting as time goes on.  The cervix usually does not dilate or become thin. Follow these instructions at home:  Take over-the-counter and prescription medicines only as told by your health care provider.  Keep up with your usual exercises and follow other instructions from your health care provider.  Eat and drink lightly if you think you are going into labor.  If Braxton Hicks contractions are making you uncomfortable: ? Change your position from lying down or resting to walking, or change from walking to resting. ? Sit and rest in a tub of warm water. ? Drink enough fluid to keep your urine pale yellow. Dehydration may cause these contractions. ? Do slow and deep breathing several times an hour.  Keep  all follow-up prenatal visits as told by your health care provider. This is important.   Contact a health care provider if:  You have a fever.  You have continuous pain in your abdomen. Get help right away if:  Your contractions become stronger, more regular, and closer together.  You have fluid leaking or gushing from your vagina.  You pass blood-tinged mucus (bloody show).  You have bleeding from your vagina.  You have low back pain that you never had before.  You feel your baby's head pushing down and causing pelvic pressure.  Your baby is not moving inside you as much as it used to. Summary  Contractions that occur before labor are called Braxton Hicks contractions, false labor, or practice contractions.  Braxton Hicks contractions are usually shorter, weaker, farther apart, and less regular than true labor contractions. True labor contractions usually become progressively stronger and regular, and they become more frequent.  Manage discomfort from Braxton Hicks contractions by changing position, resting in a warm bath, drinking plenty of water, or practicing deep breathing. This information is not intended to replace advice given to you by your health care provider. Make sure you discuss any questions you have with your health care provider. Document Revised: 04/22/2017 Document Reviewed: 09/23/2016 Elsevier Patient Education  2021 Elsevier Inc.  

## 2020-06-13 NOTE — Progress Notes (Signed)
I connected with  Shannon Lyons on 06/13/20 at  8:35 AM EST by Mychart virtual and verified that I am speaking with the correct person using two identifiers.   I discussed the limitations, risks, security and privacy concerns of performing an evaluation and management service by virtual/ Mychart and the availability of in person appointments. I also discussed with the patient that there may be a patient responsible charge related to this service. The patient expressed understanding and agreed to proceed.  We discussed she has started [employer] but has not logged in any bp readings yet. I encouraged her to start doing bp weekly and logging them into Babys[employer]. She took her bp during our visit.   Dyshaun Bonzo,RN 06/13/2020  8:31 AM

## 2020-06-20 ENCOUNTER — Ambulatory Visit (INDEPENDENT_AMBULATORY_CARE_PROVIDER_SITE_OTHER): Payer: BC Managed Care – PPO | Admitting: *Deleted

## 2020-06-20 ENCOUNTER — Other Ambulatory Visit: Payer: Self-pay

## 2020-06-20 ENCOUNTER — Telehealth: Payer: Self-pay | Admitting: *Deleted

## 2020-06-20 DIAGNOSIS — O368130 Decreased fetal movements, third trimester, not applicable or unspecified: Secondary | ICD-10-CM | POA: Diagnosis not present

## 2020-06-20 LAB — FETAL NONSTRESS TEST

## 2020-06-20 NOTE — Progress Notes (Signed)
Pt presents for NST as scheduled due to decreased fetal movement since yesterday. Due to pt sx of fever 100.4, H/A and nausea since last night, she was treated as Covid+ and placed in negative pressure room for NST. Vital signs were not assessed. Pt states she has no nausea or H/A this morning. She had taken Tylenol earlier today for fever and reports most recent temp was 98.  Pt was advised to continue to monitor her sx and treat with OTC meds. If she develops worsening sx, she should get a Covid test. NST is reactive and pt felt very good FM. She feels reassured about fetal well-being. Pt does not need to go to hospital unless she is having difficulty breathing or has new change in decreased fetal movement. She voiced understanding of all information and instructions given.

## 2020-06-20 NOTE — Telephone Encounter (Signed)
Call received from [facility] Nurse @ [phone] who stated that pt is calling to report concerns. She has a fever, nausea and decreased fetal movement. I advised that I am currently with a patient however will call Ms Shannon Lyons back within 30 minutes.   0918  I called pt and discussed her sx. She reported having a fever which began last night. This morning it was 100.4. She took Tylenol and states the temp is now 99. Pt also had nausea beginning last night. This morning she has observed decreased FM. I advised pt that we would like her to come in for NST today @ 1100. Due to her other sx we will have to treat her as Covid +. She should come to appt alone and wear a mask. Pt also should continue to eat and drink some cold liquids and monitor FM. If anything changes before her appt, she should notify our office via [place] message. Pt agreed to plan of care and voiced understanding of instructions.

## 2020-06-25 ENCOUNTER — Encounter: Payer: Self-pay | Admitting: *Deleted

## 2020-06-30 ENCOUNTER — Telehealth (INDEPENDENT_AMBULATORY_CARE_PROVIDER_SITE_OTHER): Payer: BC Managed Care – PPO | Admitting: Nurse Practitioner

## 2020-06-30 VITALS — BP 134/79 | HR 81

## 2020-06-30 DIAGNOSIS — Z87448 Personal history of other diseases of urinary system: Secondary | ICD-10-CM

## 2020-06-30 DIAGNOSIS — Z9079 Acquired absence of other genital organ(s): Secondary | ICD-10-CM

## 2020-06-30 DIAGNOSIS — Z3A34 34 weeks gestation of pregnancy: Secondary | ICD-10-CM

## 2020-06-30 DIAGNOSIS — Z3403 Encounter for supervision of normal first pregnancy, third trimester: Secondary | ICD-10-CM

## 2020-06-30 NOTE — Progress Notes (Signed)
   OBSTETRICS PRENATAL VIRTUAL VISIT ENCOUNTER NOTE  Provider location: [facility] [place] [facility]   I connected with Shannon Lyons on 06/30/20 at  2:55 PM EST by MyChart Video Encounter at home and verified that I am speaking with the correct person using two identifiers.   I discussed the limitations, risks, security and privacy concerns of performing an evaluation and management service virtually and the availability of in person appointments. I also discussed with the patient that there may be a patient responsible charge related to this service. The patient expressed understanding and agreed to proceed. Subjective:  Shannon Lyons is a 25 y.o. G1P0000 at [redacted]w[redacted]d being seen today for ongoing prenatal care.  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  Patient reports no complaints.  Contractions: Not present. Vag. Bleeding: None.  Movement: Present. Denies any leaking of fluid.   The following portions of the patient's history were reviewed and updated as appropriate: allergies, current medications, past family history, past medical history, past social history, past surgical history and problem list.   Objective:   Vitals:   06/30/20 1507  BP: 134/79  Pulse: 81    Fetal Status:     Movement: Present     General:  Alert, oriented and cooperative. Patient is in no acute distress.  Respiratory: Normal respiratory effort, no problems with respiration noted  Mental Status: Normal mood and affect. Normal behavior. Normal judgment and thought content.  Rest of physical exam deferred due to type of encounter  Imaging: No results found.  Assessment and Plan:  Pregnancy: G1P0000 at [redacted]w[redacted]d 1. Encounter for supervision of low-risk first pregnancy in third  trimester Doing well Encouraged to choose a pediatrician - client is working on it. Plans Nexplanon as contraception - has used it before Encouraged to take childbirth classes Is not planning to get covid vaccine while pregnant.   Preterm labor symptoms and general obstetric precautions including but not limited to vaginal bleeding, contractions, leaking of fluid and fetal movement were reviewed in detail with the patient. I discussed the assessment and treatment plan with the patient. The patient was provided an opportunity to ask questions and all were answered. The patient agreed with the plan and demonstrated an understanding of the instructions. The patient was advised to call back or seek an in-person office evaluation/go [facility] for any urgent or concerning symptoms. Please refer to After Visit Summary for other counseling recommendations.   I provided 5 minutes of face-to-face time during this encounter.  Return in about 2 weeks (around 07/14/2020) for in person ROB - vaginal swabs.  No future appointments. at time chart was closed.  Shannon [place], NP [facility] for [facility], [place]

## 2020-06-30 NOTE — Progress Notes (Signed)
I connected with  [name] on 06/30/20 at  2:55 PM EST by telephone and verified that I am speaking with the correct person using two identifiers.   I discussed the limitations, risks, security and privacy concerns of performing an evaluation and management service by telephone and the availability of in person appointments. I also discussed with the patient that there may be a patient responsible charge related to this service. The patient expressed understanding and agreed to proceed.  Derinda Late, RN 06/30/2020  3:15 PM

## 2020-07-15 ENCOUNTER — Other Ambulatory Visit (HOSPITAL_COMMUNITY)
Admission: RE | Admit: 2020-07-15 | Discharge: 2020-07-15 | Disposition: A | Discharge status: Home / Self Care | Payer: BC Managed Care – PPO | Source: Ambulatory Visit | Attending: Family Medicine | Admitting: Family Medicine

## 2020-07-15 ENCOUNTER — Ambulatory Visit (INDEPENDENT_AMBULATORY_CARE_PROVIDER_SITE_OTHER): Payer: BC Managed Care – PPO | Admitting: Family Medicine

## 2020-07-15 ENCOUNTER — Other Ambulatory Visit: Payer: Self-pay

## 2020-07-15 VITALS — BP 133/76 | HR 71 | Wt 177.0 lb

## 2020-07-15 DIAGNOSIS — Z3403 Encounter for supervision of normal first pregnancy, third trimester: Secondary | ICD-10-CM | POA: Diagnosis not present

## 2020-07-15 DIAGNOSIS — C562 Malignant neoplasm of left ovary: Secondary | ICD-10-CM

## 2020-07-15 DIAGNOSIS — R030 Elevated blood-pressure reading, without diagnosis of hypertension: Secondary | ICD-10-CM

## 2020-07-15 DIAGNOSIS — Z9079 Acquired absence of other genital organ(s): Secondary | ICD-10-CM

## 2020-07-15 DIAGNOSIS — Z90721 Acquired absence of ovaries, unilateral: Secondary | ICD-10-CM

## 2020-07-15 NOTE — Progress Notes (Signed)
   PRENATAL VISIT NOTE  Subjective:  Shannon Lyons is a 25 y.o. G1P0000 at [redacted]w[redacted]d being seen today for ongoing prenatal care.  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  Patient reports no complaints.  Contractions: Not present. Vag. Bleeding: None.  Movement: Present. Denies leaking of fluid.   The following portions of the patient's history were reviewed and updated as appropriate: allergies, current medications, past family history, past medical history, past social history, past surgical history and problem list.   Objective:   Vitals:   07/15/20 1529  BP: 133/76  Pulse: 71  Weight: 177 lb (80.3 kg)    Fetal Status: Fetal Heart Rate (bpm): 145 Fundal Height: 36 cm Movement: Present     General:  Alert, oriented and cooperative. Patient is in no acute distress.  Skin: Skin is warm and dry. No rash noted.   Cardiovascular: Normal heart rate noted  Respiratory: Normal respiratory effort, no problems with respiration noted  Abdomen: Soft, gravid, appropriate for gestational age.  Pain/Pressure: Present     Pelvic: Cervical exam deferred      External genitalia normal appearing.  Extremities: Normal range of motion.  Edema: Mild pitting, slight indentation  Mental Status: Normal mood and affect. Normal behavior. Normal judgment and thought content.   Assessment and Plan:  Pregnancy: G1P0000 at [redacted]w[redacted]d 1. Encounter for supervision of low-risk first pregnancy in third trimester -Doing well without complaints, no signs of preterm labor -BP mildly elevated, otherwise VSS - Culture, beta strep (group b only) - Cervicovaginal ancillary only( [place])  2. History of salpingo-oophorectomy 3. 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 Elevated BP without diagnosis of hypertension BP today 133/76 which it appears has been elevated since 05/30/20 Does not meet criteria for gHTN Asymptomatic Will obtain baseline preE labs today because no labs done yet - Comp Met ([facility]) - CBC - Protein / creatinine ratio, urine  Preterm labor symptoms and general obstetric precautions including but not limited to vaginal bleeding, contractions, leaking of fluid and fetal movement were reviewed in detail with the patient. Please refer to After Visit Summary for other counseling recommendations.   Return in about 1 week (around 07/22/2020) for [place]; in person.  No future appointments.  Arrie Senate, MD

## 2020-07-15 NOTE — Progress Notes (Signed)
Denies headaches or blurred vision

## 2020-07-16 LAB — COMPREHENSIVE METABOLIC PANEL
ALT: 20 IU/L (ref 0–32)
AST: 23 IU/L (ref 0–40)
Albumin/Globulin Ratio: 1.4 (ref 1.2–2.2)
Albumin: 3.6 g/dL — ABNORMAL LOW (ref 3.9–5.0)
Alkaline Phosphatase: 177 IU/L — ABNORMAL HIGH (ref 44–121)
BUN/Creatinine Ratio: 15 (ref 9–23)
BUN: 8 mg/dL (ref 6–20)
Bilirubin Total: <0.2 mg/dL (ref 0.0–1.2)
CO2: 18 mmol/L — ABNORMAL LOW (ref 20–29)
Calcium: 9.1 mg/dL (ref 8.7–10.2)
Chloride: 106 mmol/L (ref 96–106)
Creatinine, Ser: 0.54 mg/dL — ABNORMAL LOW (ref 0.57–1.00)
GFR calc Af Amer: 151 mL/min/{1.73_m2} (ref >59)
GFR calc non Af Amer: 131 mL/min/{1.73_m2} (ref >59)
Globulin, Total: 2.6 g/dL (ref 1.5–4.5)
Glucose: 82 mg/dL (ref 65–99)
Potassium: 4.3 mmol/L (ref 3.5–5.2)
Sodium: 138 mmol/L (ref 134–144)
Total Protein: 6.2 g/dL (ref 6.0–8.5)

## 2020-07-16 LAB — PROTEIN / CREATININE RATIO, URINE
Creatinine, Urine: 55.9 mg/dL
Protein, Ur: 6.5 mg/dL
Protein/Creat Ratio: 116 mg/g creat (ref 0–200)

## 2020-07-16 LAB — CBC
Hematocrit: 37.7 % (ref 34.0–46.6)
Hemoglobin: 12.6 g/dL (ref 11.1–15.9)
MCH: 29.2 pg (ref 26.6–33.0)
MCHC: 33.4 g/dL (ref 31.5–35.7)
MCV: 88 fL (ref 79–97)
Platelets: 203 10*3/uL (ref 150–450)
RBC: 4.31 x10E6/uL (ref 3.77–5.28)
RDW: 13.4 % (ref 11.7–15.4)
WBC: 8 10*3/uL (ref 3.4–10.8)

## 2020-07-17 LAB — CERVICOVAGINAL ANCILLARY ONLY
Chlamydia: NEGATIVE
Comment: NEGATIVE
Comment: NORMAL
Neisseria Gonorrhea: NEGATIVE

## 2020-07-17 IMAGING — CR DG HAND COMPLETE 3+V*L*
3 series · 3 of 3 positions shown · non-contrast
Comparison: None.

CLINICAL DATA: Left hand crushed between boxes.  Bruising, swelling

EXAM:
LEFT HAND - COMPLETE 3+ VIEW

[x hand pa left]
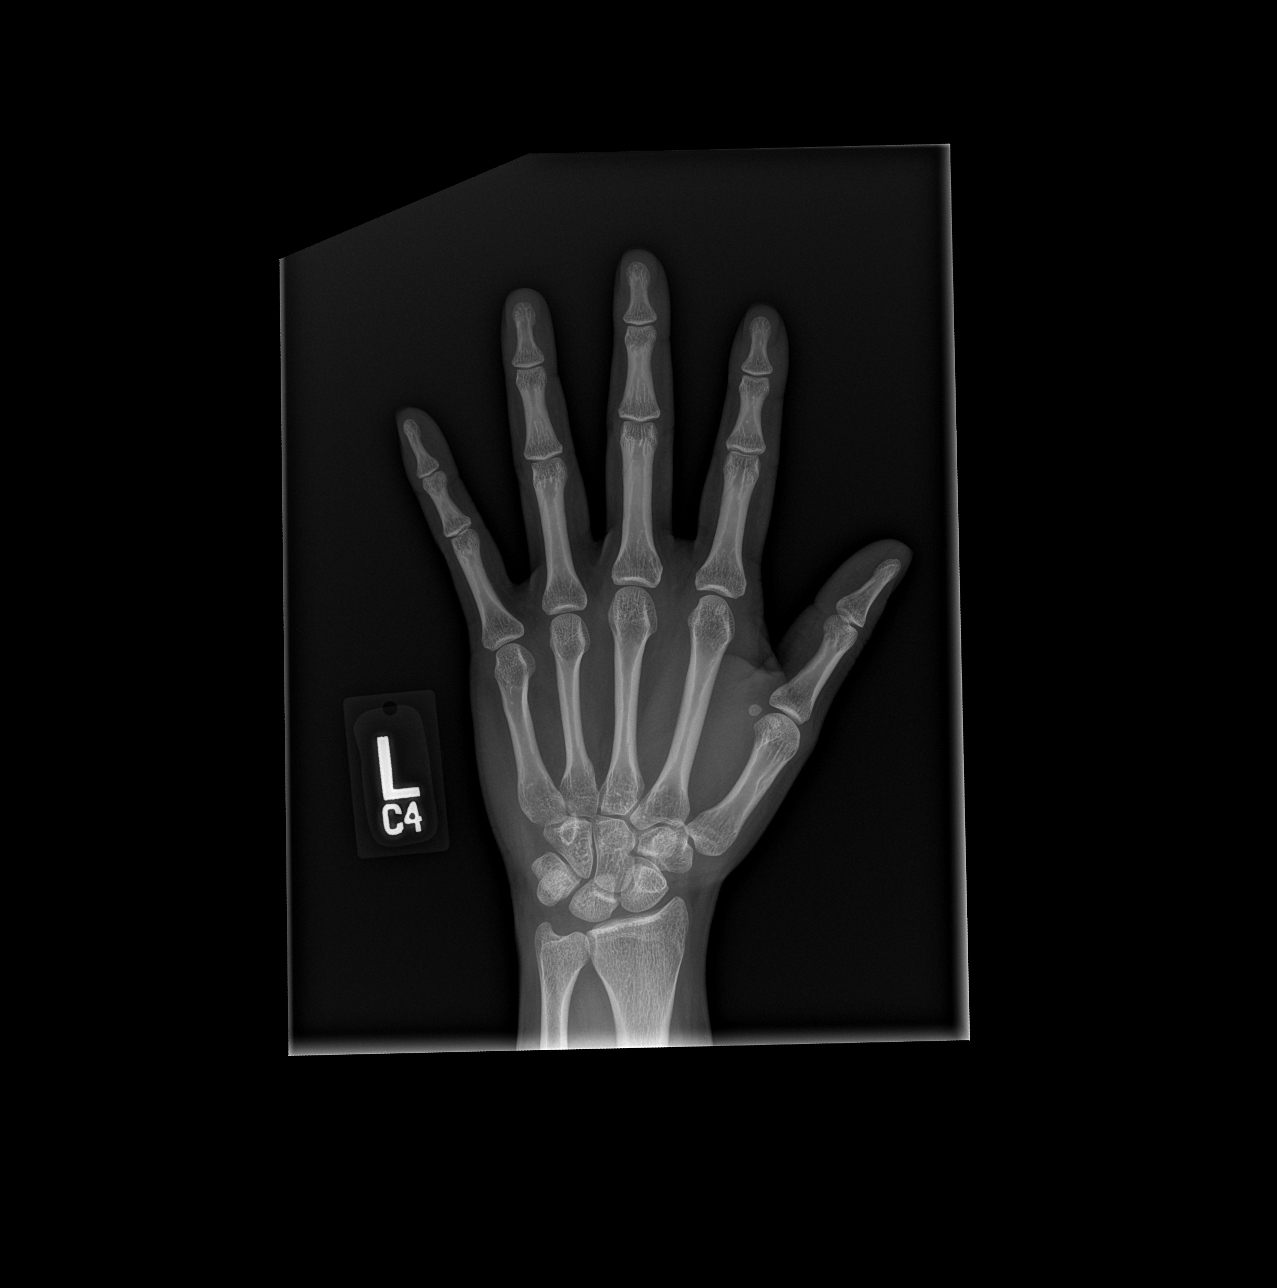

[x hand obl left]
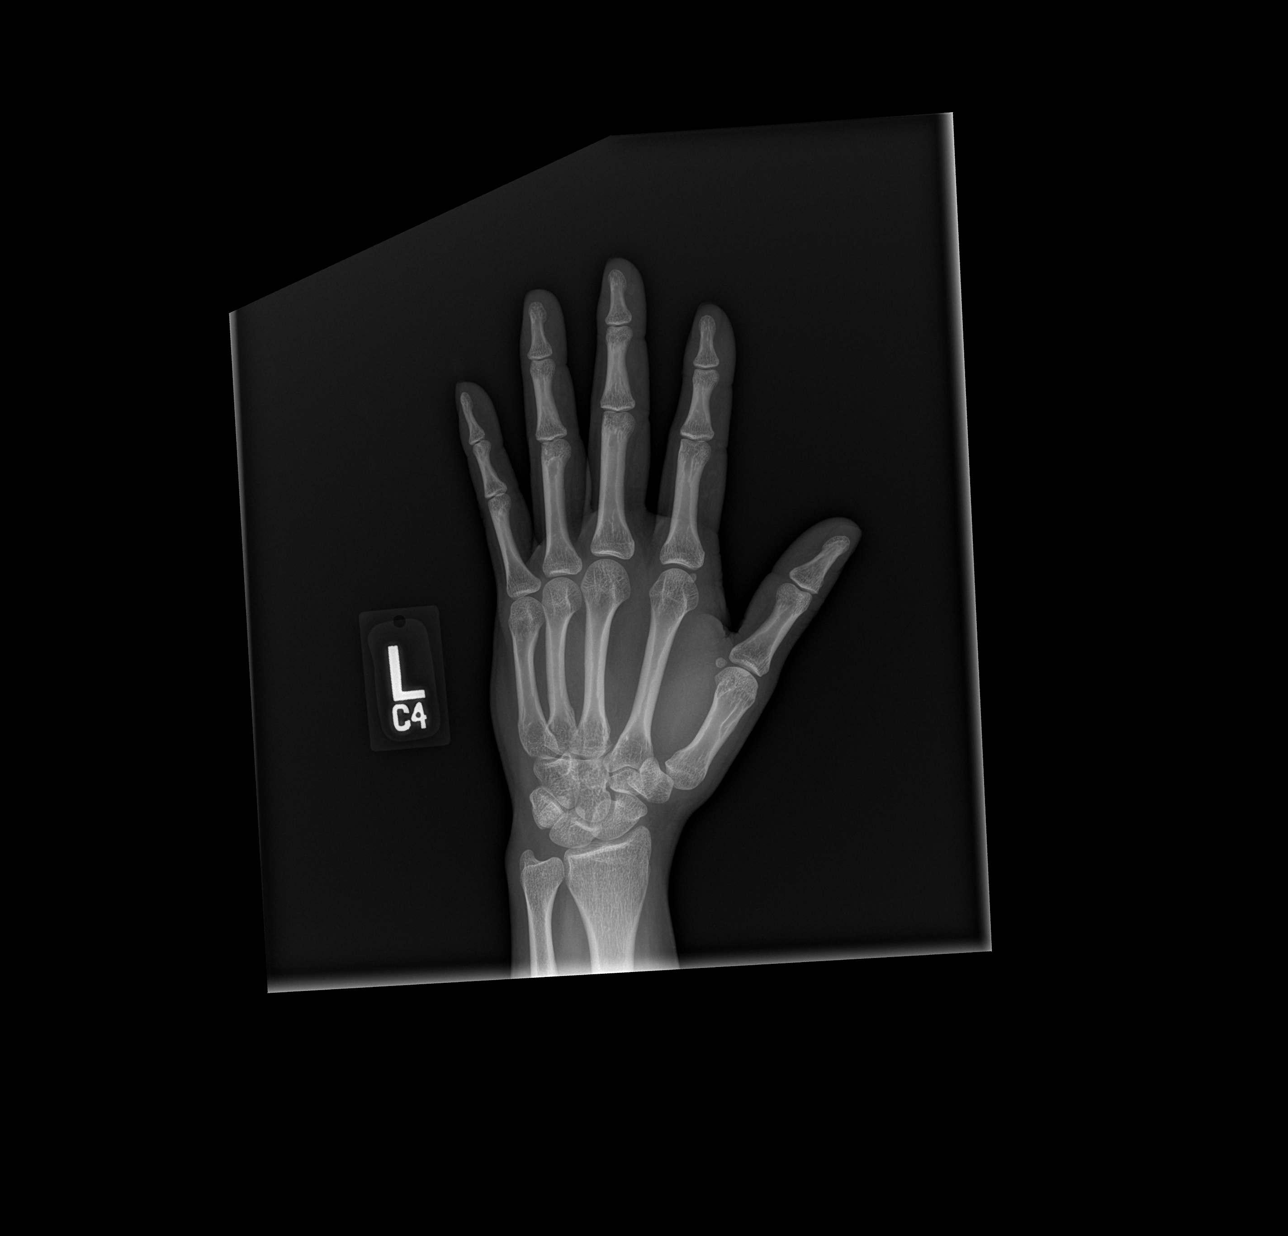

[x hand lat left]
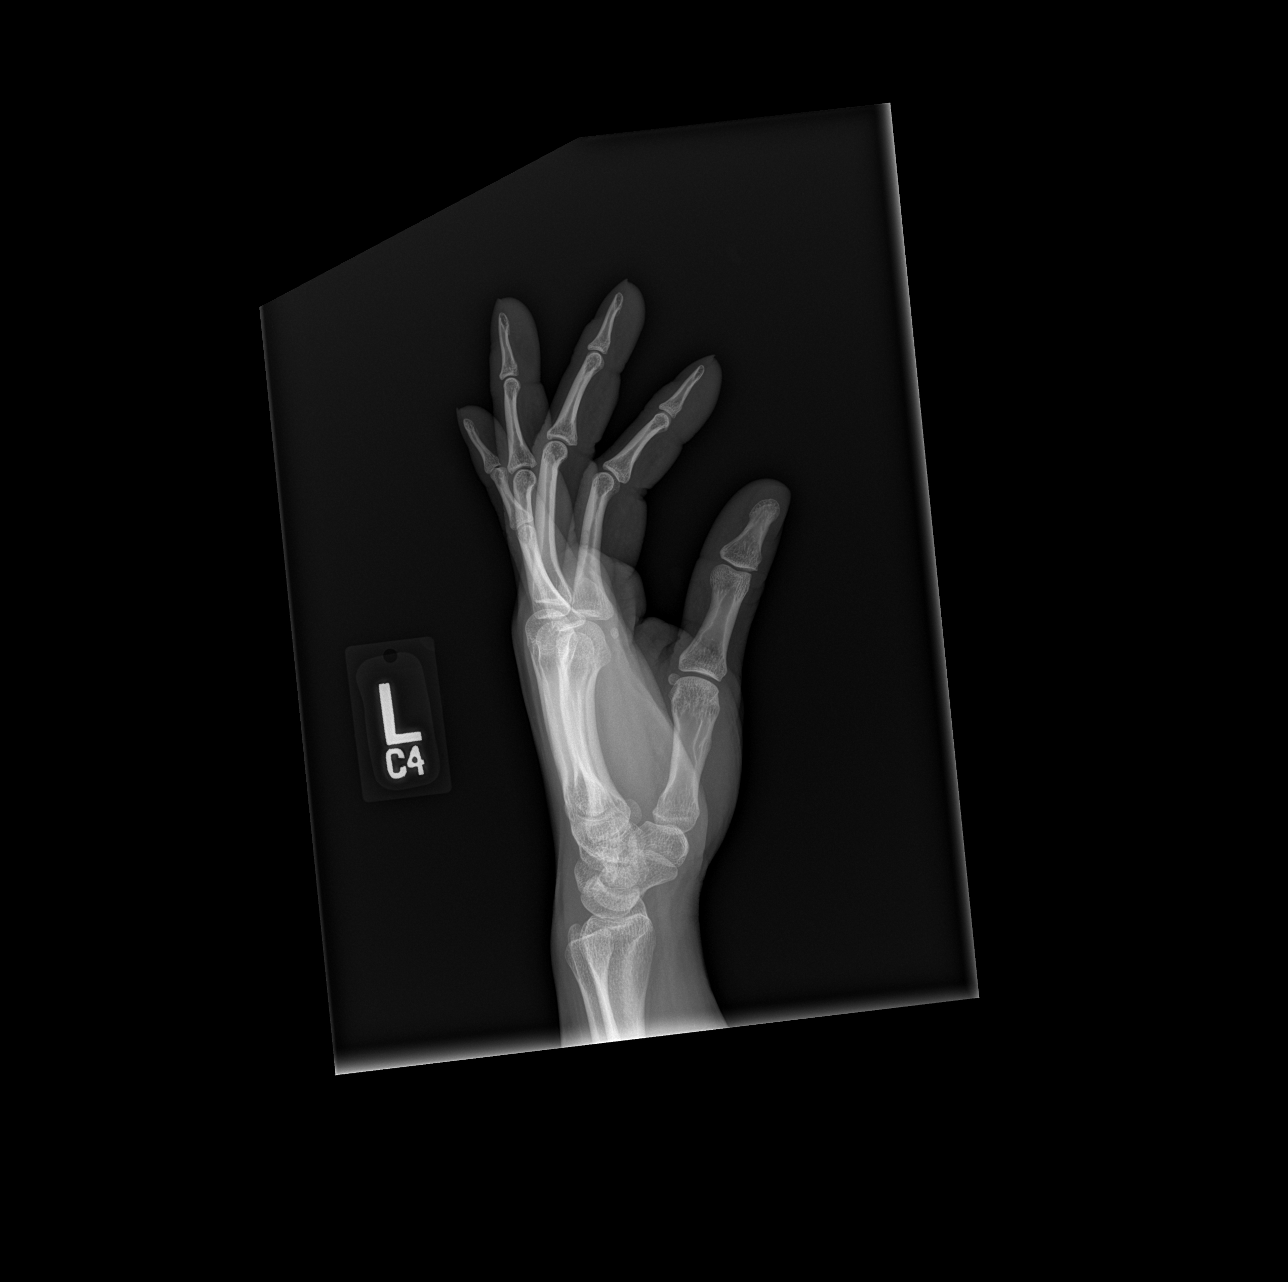

[3 of 3 positions shown; findings below may reference images not displayed]

FINDINGS: There is no evidence of fracture or dislocation. There is no
evidence of arthropathy or other focal bone abnormality. Soft
tissues are unremarkable.
IMPRESSION: Negative.

## 2020-07-18 ENCOUNTER — Encounter (HOSPITAL_COMMUNITY): Payer: Self-pay | Admitting: Obstetrics and Gynecology

## 2020-07-18 ENCOUNTER — Inpatient Hospital Stay (HOSPITAL_COMMUNITY)
Admission: AD | Admit: 2020-07-18 | Discharge: 2020-07-18 | Disposition: A | Discharge status: Home / Self Care | Payer: BC Managed Care – PPO | Attending: Obstetrics and Gynecology | Admitting: Obstetrics and Gynecology

## 2020-07-18 DIAGNOSIS — O10913 Unspecified pre-existing hypertension complicating pregnancy, third trimester: Secondary | ICD-10-CM | POA: Diagnosis not present

## 2020-07-18 DIAGNOSIS — O26893 Other specified pregnancy related conditions, third trimester: Secondary | ICD-10-CM | POA: Insufficient documentation

## 2020-07-18 DIAGNOSIS — Z3689 Encounter for other specified antenatal screening: Secondary | ICD-10-CM | POA: Insufficient documentation

## 2020-07-18 DIAGNOSIS — Z3A36 36 weeks gestation of pregnancy: Secondary | ICD-10-CM | POA: Diagnosis not present

## 2020-07-18 DIAGNOSIS — R030 Elevated blood-pressure reading, without diagnosis of hypertension: Secondary | ICD-10-CM | POA: Insufficient documentation

## 2020-07-18 DIAGNOSIS — R519 Headache, unspecified: Secondary | ICD-10-CM | POA: Diagnosis not present

## 2020-07-18 DIAGNOSIS — Z3403 Encounter for supervision of normal first pregnancy, third trimester: Secondary | ICD-10-CM

## 2020-07-18 LAB — PROTEIN / CREATININE RATIO, URINE
Creatinine, Urine: 46.08 mg/dL
Protein Creatinine Ratio: 0.15 mg/mg{Cre} (ref 0.00–0.15)
Total Protein, Urine: 7 mg/dL

## 2020-07-18 LAB — URINALYSIS, ROUTINE W REFLEX MICROSCOPIC
Bilirubin Urine: NEGATIVE
Glucose, UA: NEGATIVE mg/dL
Hgb urine dipstick: NEGATIVE
Ketones, ur: NEGATIVE mg/dL
Nitrite: NEGATIVE
Protein, ur: NEGATIVE mg/dL
Specific Gravity, Urine: 1.009 (ref 1.005–1.030)
pH: 6 (ref 5.0–8.0)

## 2020-07-18 LAB — COMPREHENSIVE METABOLIC PANEL
ALT: 18 U/L (ref 0–44)
AST: 24 U/L (ref 15–41)
Albumin: 2.8 g/dL — ABNORMAL LOW (ref 3.5–5.0)
Alkaline Phosphatase: 130 U/L — ABNORMAL HIGH (ref 38–126)
Anion gap: 10 (ref 5–15)
BUN: 6 mg/dL (ref 6–20)
CO2: 17 mmol/L — ABNORMAL LOW (ref 22–32)
Calcium: 9.1 mg/dL (ref 8.9–10.3)
Chloride: 107 mmol/L (ref 98–111)
Creatinine, Ser: 0.54 mg/dL (ref 0.44–1.00)
GFR, Estimated: >60 mL/min (ref >60)
Glucose, Bld: 79 mg/dL (ref 70–99)
Potassium: 3.8 mmol/L (ref 3.5–5.1)
Sodium: 134 mmol/L — ABNORMAL LOW (ref 135–145)
Total Bilirubin: <0.1 mg/dL — ABNORMAL LOW (ref 0.3–1.2)
Total Protein: 6 g/dL — ABNORMAL LOW (ref 6.5–8.1)

## 2020-07-18 LAB — CBC
HCT: 35.5 % — ABNORMAL LOW (ref 36.0–46.0)
Hemoglobin: 12.5 g/dL (ref 12.0–15.0)
MCH: 30.3 pg (ref 26.0–34.0)
MCHC: 35.2 g/dL (ref 30.0–36.0)
MCV: 86.2 fL (ref 80.0–100.0)
Platelets: 208 10*3/uL (ref 150–400)
RBC: 4.12 MIL/uL (ref 3.87–5.11)
RDW: 13.8 % (ref 11.5–15.5)
WBC: 9.8 10*3/uL (ref 4.0–10.5)
nRBC: 0 % (ref 0.0–0.2)

## 2020-07-18 MED ORDER — METOCLOPRAMIDE HCL 10 MG PO TABS
10.0000 mg | ORAL_TABLET | Freq: Once | ORAL | Status: AC
  Administered 2020-07-18: 10 mg via ORAL
  Filled 2020-07-18: qty 1

## 2020-07-18 MED ORDER — ACETAMINOPHEN 500 MG PO TABS
1000.0000 mg | ORAL_TABLET | Freq: Once | ORAL | Status: AC
  Administered 2020-07-18: 1000 mg via ORAL
  Filled 2020-07-18: qty 2

## 2020-07-18 NOTE — Discharge Instructions (Signed)
Preeclampsia and Eclampsia Preeclampsia is a serious condition that may develop during pregnancy. This condition involves high blood pressure during pregnancy and causes symptoms such as headaches, vision changes, and increased swelling in the legs, hands, and face. Preeclampsia occurs after 20 weeks of pregnancy. Eclampsia is a seizure that happens from worsening preeclampsia. Diagnosing and managing preeclampsia early is important. If not treated early, it can cause serious problems for mother and baby. There is no cure for this condition. However, during pregnancy, delivering the baby may be the best treatment for preeclampsia or eclampsia. For most women, symptoms of preeclampsia and eclampsia go away after giving birth. In rare cases, a woman may develop preeclampsia or eclampsia after giving birth. This usually occurs within 48 hours after childbirth but may occur up to 6 weeks after giving birth. What are the causes? The cause of this condition is not known. What increases the risk? The following factors make you more likely to develop preeclampsia:  Being pregnant for the first time or being pregnant with multiples.  Having had preeclampsia or a condition called hemolysis, elevated liver enzymes, and low platelet count (HELLP)syndrome during a past pregnancy.  Having a family history of preeclampsia.  Being older than age 27.  Being obese.  Becoming pregnant through fertility treatments. Conditions that reduce blood flow or oxygen to your placenta and baby may also increase your risk. These include:  High blood pressure before, during, or immediately following pregnancy.  Kidney disease.  Diabetes.  Blood clotting disorders.  Autoimmune diseases, such as lupus.  Sleep apnea. What are the signs or symptoms? Common symptoms of this condition include:  A severe, throbbing headache that does not go away.  Vision problems, such as blurred or double vision and light  sensitivity.  Pain in the stomach, especially the right upper region.  Pain in the shoulder. Other symptoms that may develop as the condition gets worse include:  Sudden weight gain because of fluid buildup in the body. This causes swelling of the face, hands, legs, and feet.  Severe nausea and vomiting.  Urinating less than usual.  Shortness of breath.  Seizures. How is this diagnosed? Your health care provider will ask you about symptoms and check for signs of preeclampsia during your prenatal visits. You will also have routine tests, including:  Checking your blood pressure.  Urine tests to check for protein.  Blood tests to assess your organ function.  Monitoring your baby's heart rate.  Ultrasounds to check fetal growth.   How is this treated? You and your health care provider will determine the treatment that is best for you. Treatment may include:  Frequent prenatal visits to check for preeclampsia.  Medicine to lower your blood pressure.  Medicine to prevent seizures.  Low-dose aspirin during your pregnancy.  Staying in the hospital, in severe cases. You will be given medicines to control your blood pressure and the amount of fluids in your body.  Delivering your baby. Work with your health care provider to manage any chronic health conditions, such as diabetes or kidney problems. Also, work with your health care provider to manage weight gain during pregnancy. Follow these instructions at home: Eating and drinking  Drink enough fluid to keep your urine pale yellow.  Avoid caffeine. Caffeine may increase blood pressure and heart rate and lead to dehydration.  Reduce the amount of salt that you eat. Lifestyle  Do not use any products that contain nicotine or tobacco. These products include cigarettes, chewing tobacco, and  vaping devices, such as e-cigarettes. If you need help quitting, ask your health care provider.  Do not use alcohol or drugs.  Avoid  stress as much as possible.  Rest and get plenty of sleep. General instructions  Take over-the-counter and prescription medicines only as told by your health care provider.  When lying down, lie on your left side. This keeps pressure off your major blood vessels.  When sitting or lying down, raise (elevate) your feet. Try putting pillows underneath your lower legs.  Exercise regularly. Ask your health care provider what kinds of exercise are best for you.  Check your blood pressure as often as recommended by your health care provider.  Keep all prenatal and follow-up visits. This is important.   Contact a health care provider if:  You have symptoms that may need treatment or closer monitoring. These include: ? Headaches. ? Stomach pain or nausea and vomiting. ? Shoulder pain. ? Vision problems, such as spots in front of your eyes or blurry vision. ? Sudden weight gain or increased swelling in your face, hands, legs, and feet. ? Increased anxiety or feeling of impending doom. ? Signs or symptoms of labor. Get help right away if:  You have any of the following symptoms: ? A seizure. ? Shortness of breath or trouble breathing. ? Trouble speaking or slurred speech. ? Fainting. ? Chest pain. These symptoms may represent a serious problem that is an emergency. Do not wait to see if the symptoms will go away. Get medical help right away. Call your local emergency services (911 in the [place] Do not drive yourself to the hospital. Summary  Preeclampsia is a serious condition that may develop during pregnancy.  Diagnosing and treating preeclampsia early is very important.  Keep all prenatal and follow-up visits. This is important.  Get help right away if you have a seizure, shortness of breath or trouble breathing, trouble speaking or slurred speech, chest pain, or fainting. This information is not intended to replace advice given to you by your health care provider. Make sure you  discuss any questions you have with your health care provider. Document Revised: 01/31/2020 Document Reviewed: 01/31/2020 Elsevier Patient Education  2021 [place].        Signs and Symptoms of Labor Labor is the body's natural process of moving the baby and the placenta out of the uterus. The process of labor usually starts when the baby is full-term, between 84 and 40 weeks of pregnancy. Signs and symptoms that you are close to going into labor As your body prepares for labor and the birth of your baby, you may notice the following symptoms in the weeks and days before true labor starts:  Passing a small amount of thick, bloody mucus from your vagina. This is called normal bloody show or losing your mucus plug. This may happen more than a week before labor begins, or right before labor begins, as the opening of the cervix starts to widen (dilate). For some women, the entire mucus plug passes at once. For others, pieces of the mucus plug may gradually pass over several days.  Your baby moving (dropping) lower in your pelvis to get into position for birth (lightening). When this happens, you may feel more pressure on your bladder and pelvic bone and less pressure on your ribs. This may make it easier to breathe. It may also cause you to need to urinate more often and have problems with bowel movements.  Having "practice contractions," also called  Braxton Hicks contractions or false labor. These occur at irregular (unevenly spaced) intervals that are more than 10 minutes apart. False labor contractions are common after exercise or sexual activity. They will stop if you change position, rest, or drink fluids. These contractions are usually mild and do not get stronger over time. They may feel like: ? A backache or back pain. ? Mild cramps, similar to menstrual cramps. ? Tightening or pressure in your abdomen. Other early symptoms include:  Nausea or loss of  appetite.  Diarrhea.  Having a sudden burst of energy, or feeling very tired.  Mood changes.  Having trouble sleeping.   Signs and symptoms that labor has begun Signs that you are in labor may include:  Having contractions that come at regular (evenly spaced) intervals and increase in intensity. This may feel like more intense tightening or pressure in your abdomen that moves to your back. ? Contractions may also feel like rhythmic pain in your upper thighs or back that comes and goes at regular intervals. ? For first-time mothers, this change in intensity of contractions often occurs at a more gradual pace. ? Women who have given birth before may notice a more rapid progression of contraction changes.  Feeling pressure in the vaginal area.  Your water breaking (rupture of membranes). This is when the sac of fluid that surrounds your baby breaks. Fluid leaking from your vagina may be clear or blood-tinged. Labor usually starts within 24 hours of your water breaking, but it may take longer to begin. ? Some women may feel a sudden gush of fluid. ? Others notice that their underwear repeatedly becomes damp. Follow these instructions at home:  When labor starts, or if your water breaks, call your health care provider or nurse care line. Based on your situation, they will determine when you should go in for an exam.  During early labor, you may be able to rest and manage symptoms at home. Some strategies to try at home include: ? Breathing and relaxation techniques. ? Taking a warm bath or shower. ? Listening to music. ? Using a heating pad on the lower back for pain. If you are directed to use heat:  Place a towel between your skin and the heat source.  Leave the heat on for 20-30 minutes.  Remove the heat if your skin turns bright red. This is especially important if you are unable to feel pain, heat, or cold. You may have a greater risk of getting burned.   Contact a health care  provider if:  Your labor has started.  Your water breaks. Get help right away if:  You have painful, regular contractions that are 5 minutes apart or less.  Labor starts before you are [redacted] weeks along in your pregnancy.  You have a fever.  You have bright red blood coming from your vagina.  You do not feel your baby moving.  You have a severe headache with or without vision problems.  You have severe nausea, vomiting, or diarrhea.  You have chest pain or shortness of breath. These symptoms may represent a serious problem that is an emergency. Do not wait to see if the symptoms will go away. Get medical help right away. Call your local emergency services (911 in the [place] Do not drive yourself to the hospital. Summary  Labor is your body's natural process of moving your baby and the placenta out of your uterus.  The process of labor usually starts when your baby is  full-term, between 40 and 40 weeks of pregnancy.  When labor starts, or if your water breaks, call your health care provider or nurse care line. Based on your situation, they will determine when you should go in for an exam. This information is not intended to replace advice given to you by your health care provider. Make sure you discuss any questions you have with your health care provider. Document Revised: 03/01/2020 Document Reviewed: 03/01/2020 Elsevier Patient Education  2021 [employer].

## 2020-07-18 NOTE — MAU Provider Note (Signed)
History     CSN: [id number]  Arrival date and time: 07/18/20 1318   Event Date/Time   First Provider Initiated Contact with Patient 07/18/20 1418      Chief Complaint  Patient presents with  . Hypertension   Ms. Shannon Lyons is a 25 y.o. G1P0000 at [redacted]w[redacted]d who presents to MAU for preeclampsia evaluation after she developed a headache at home and took her blood pressure and found it was elevated. Patient reports her BP cuff has not been checked for fit by her office. Patient reports HA is intermittent, but is present at this time and rates as 7/10. Patient denies any headache diagnoses. Patient has not taken anything for her headache. Patient is here because she called her office and was told to come to MAU for evaluation. Patient denies any concerns other than her elevated BP and HA.  Pt denies blurry vision/seeing spots, N/V, epigastric pain, swelling in face and hands, sudden weight gain. Pt denies chest pain and SOB.  Pt denies constipation, diarrhea, or urinary problems. Pt denies fever, chills, fatigue, sweating or changes in appetite. Pt denies dizziness, light-headedness, weakness.  Pt denies VB, ctx, LOF and reports good FM.  Current PNC & next appt? [place], 07/30/2020   OB History    Gravida  1   Para  0   Term  0   Preterm  0   AB  0   Living  0     SAB  0   IAB  0   Ectopic  0   Multiple  0   Live Births  0           Past Medical History:  Diagnosis Date  . Pyelonephritis 02/2015  . UTI (lower urinary tract infection)     Past Surgical History:  Procedure Laterality Date  . 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 Diabetes Maternal Grandfather   . Cancer Paternal Grandfather   . Diabetes Maternal Uncle     Social History   Tobacco Use  . Smoking status: Never Smoker  . Smokeless tobacco: Never Used  Vaping Use  . Vaping Use:  Never used  Substance Use Topics  . Alcohol use: Not Currently  . Drug use: No    Allergies: No Known Allergies  Medications Prior to Admission  Medication Sig Dispense Refill Last Dose  . Prenatal Vit-Fe Fumarate-FA (PRENATAL PO) Take by mouth.   07/18/2020 at Unknown time    Review of Systems  Constitutional: Negative for chills, diaphoresis, fatigue and fever.  Eyes: Negative for visual disturbance.  Respiratory: Negative for shortness of breath.   Cardiovascular: Negative for chest pain.  Gastrointestinal: Negative for abdominal pain, constipation, diarrhea, nausea and vomiting.  Genitourinary: Negative for dysuria, flank pain, frequency, pelvic pain, urgency, vaginal bleeding and vaginal discharge.  Neurological: Positive for headaches. Negative for dizziness, weakness and light-headedness.   Physical Exam   Blood pressure 126/69, pulse 76, resp. rate 18, height 5\' 3"  (1.6 m), weight 81.9 kg, last menstrual period 11/05/2019, SpO2 99 %.  Patient Vitals for the past 24 hrs:  BP Pulse Resp SpO2 Height Weight  07/18/20 1540 -- -- -- 99 % -- --  07/18/20 1535 -- -- -- 99 % -- --  07/18/20 1531 126/69 76 -- -- -- --  07/18/20 1530 -- -- -- 99 % -- --  07/18/20 1525 -- -- -- 98 % -- --  07/18/20 1520 -- -- -- 99 % -- --  07/18/20 1515 135/80 75 -- 99 % -- --  07/18/20 1510 -- -- -- 99 % -- --  07/18/20 1505 -- -- -- 98 % -- --  07/18/20 1501 124/65 68 -- -- -- --  07/18/20 1500 -- -- -- 99 % -- --  07/18/20 1455 -- -- -- 99 % -- --  07/18/20 1450 -- -- -- 97 % -- --  07/18/20 1445 133/79 79 -- 98 % -- --  07/18/20 1440 -- -- -- 99 % -- --  07/18/20 1435 -- -- -- 99 % -- --  07/18/20 1431 128/75 64 -- -- -- --  07/18/20 1430 -- -- -- 98 % -- --  07/18/20 1425 -- -- -- 98 % -- --  07/18/20 1420 -- -- -- 99 % -- --  07/18/20 1415 134/75 83 -- 99 % -- --  07/18/20 1410 -- -- -- 99 % -- --  07/18/20 1405 -- -- -- 99 % -- --  07/18/20 1400 136/79 74 -- 99 % -- --  07/18/20  1355 -- -- -- 99 % -- --  07/18/20 1351 135/76 74 -- -- -- --  07/18/20 1350 -- -- -- 99 % -- --  07/18/20 1330 (!) 147/93 68 18 100 % 5\' 3"  (1.6 m) 81.9 kg   Physical Exam Vitals and nursing note reviewed.  Constitutional:      General: She is not in acute distress.    Appearance: Normal appearance. She is not ill-appearing, toxic-appearing or diaphoretic.  HENT:     Head: Normocephalic and atraumatic.  Pulmonary:     Effort: Pulmonary effort is normal.  Neurological:     Mental Status: She is alert and oriented to person, place, and time.  Psychiatric:        Mood and Affect: Mood normal.        Behavior: Behavior normal.        Thought Content: Thought content normal.        Judgment: Judgment normal.    Results for orders placed or performed during the hospital encounter of 07/18/20 (from the past 24 hour(s))  Urinalysis, Routine w reflex microscopic Urine, Clean Catch     Status: Abnormal   Collection Time: 07/18/20  1:50 PM  Result Value Ref Range   Color, Urine YELLOW YELLOW   APPearance HAZY (A) CLEAR   Specific Gravity, Urine 1.009 1.005 - 1.030   pH 6.0 5.0 - 8.0   Glucose, UA NEGATIVE NEGATIVE mg/dL   Hgb urine dipstick NEGATIVE NEGATIVE   Bilirubin Urine NEGATIVE NEGATIVE   Ketones, ur NEGATIVE NEGATIVE mg/dL   Protein, ur NEGATIVE NEGATIVE mg/dL   Nitrite NEGATIVE NEGATIVE   Leukocytes,Ua MODERATE (A) NEGATIVE   RBC / HPF 0-5 0 - 5 RBC/hpf   WBC, UA 6-10 0 - 5 WBC/hpf   Bacteria, UA MANY (A) NONE SEEN   Squamous Epithelial / LPF 0-5 0 - 5  Protein / creatinine ratio, urine     Status: None   Collection Time: 07/18/20  1:50 PM  Result Value Ref Range   Creatinine, Urine 46.08 mg/dL   Total Protein, Urine 7 mg/dL   Protein Creatinine Ratio 0.15 0.00 - 0.15 mg/mg[Cre]  CBC     Status: Abnormal   Collection Time: 07/18/20  1:58 PM  Result Value Ref Range   WBC 9.8 4.0 - 10.5 K/uL  RBC 4.12 3.87 - 5.11 MIL/uL   Hemoglobin 12.5 12.0 - 15.0 g/dL   HCT 35.5  (L) 36.0 - 46.0 %   MCV 86.2 80.0 - 100.0 fL   MCH 30.3 26.0 - 34.0 pg   MCHC 35.2 30.0 - 36.0 g/dL   RDW 13.8 11.5 - 15.5 %   Platelets 208 150 - 400 K/uL   nRBC 0.0 0.0 - 0.2 %  Comprehensive metabolic panel     Status: Abnormal   Collection Time: 07/18/20  1:58 PM  Result Value Ref Range   Sodium 134 (L) 135 - 145 mmol/L   Potassium 3.8 3.5 - 5.1 mmol/L   Chloride 107 98 - 111 mmol/L   CO2 17 (L) 22 - 32 mmol/L   Glucose, Bld 79 70 - 99 mg/dL   BUN 6 6 - 20 mg/dL   Creatinine, Ser 0.54 0.44 - 1.00 mg/dL   Calcium 9.1 8.9 - 10.3 mg/dL   Total Protein 6.0 (L) 6.5 - 8.1 g/dL   Albumin 2.8 (L) 3.5 - 5.0 g/dL   AST 24 15 - 41 U/L   ALT 18 0 - 44 U/L   Alkaline Phosphatase 130 (H) 38 - 126 U/L   Total Bilirubin <0.1 (L) 0.3 - 1.2 mg/dL   GFR, Estimated >60 >60 mL/min   Anion gap 10 5 - 15    MAU Course  Procedures  MDM -preeclampsia evaluation without severe range BP in MAU on admission -single elevated BP of 147/93, followed by all normal pressures, no elevated pressures documented in prenatal record -symptoms include: HA; Tylenol and Reglan given given -UA: hazy/mod leuks/many bacteria, sending urine for culture -CBC: H/H 12.5/35.5, platelets 208 -CMP: serum creatinine 0.54, AST/ALT 24/18 -PCr: 0.15 -EFM: reactive       -baseline: 150/135       -variability: moderate       -accels: present, 15x15       -decels: absent       -TOCO: few, irregular ctx, pt not feeling -after medication administration, pt reports HA now 1/10 -pt discharged to home in stable condition  Orders Placed This Encounter  Procedures  . Culture, OB Urine    Standing Status:   Standing    Number of Occurrences:   1  . Urinalysis, Routine w reflex microscopic Urine, Clean Catch    Standing Status:   Standing    Number of Occurrences:   1  . Protein / creatinine ratio, urine    Standing Status:   Standing    Number of Occurrences:   1  . CBC    Standing Status:   Standing    Number of  Occurrences:   1  . Comprehensive metabolic panel    Standing Status:   Standing    Number of Occurrences:   1  . Discharge patient    Order Specific Question:   Discharge disposition    Answer:   01-Home or Self Care [1]    Order Specific Question:   Discharge patient date    Answer:   07/18/2020   Meds ordered this encounter  Medications  . acetaminophen (TYLENOL) tablet 1,000 mg  . metoCLOPramide (REGLAN) tablet 10 mg    Assessment and Plan   1. Pregnancy headache in third trimester   2. Encounter for supervision of low-risk first pregnancy in third trimester   3. Elevated blood pressure reading in office without diagnosis of hypertension   4. [redacted] weeks gestation of pregnancy   5.  NST (non-stress test) reactive     Allergies as of 07/18/2020   No Known Allergies     Medication List    TAKE these medications   PRENATAL PO Take by mouth.      -will call with culture results, if requiring treatment -Reviewed warning blood pressure values (systolic = / > 768 and/or diastolic =/> 90). Explained that, if blood pressure is elevated, she should sit down, rest, and eat/drink something. If still elevated 15 minutes later, and she is greater than 20 weeks, she should call clinic or come to MAU. She should come to MAU if she has elevated pressures and any of the following:  -headache not relieved with tylenol, rest, hydration -blurry vision, floating spots in her vision -sudden full-body edema or facial edema -RUQ pain that is constant. -chest pain or shortness of breath -new onset or sudden worsening of nausea and vomiting These symptoms may indicate that her blood pressure is worsening and she may be developing gestational hypertension or pre-eclampsia, which is an emergency.  -BP check on Monday, message sent to [facility] with high importance, pt to call office at [place] if she does not hear from anyone in the AM -return MAU precautions given -pt discharged to home in stable  condition  Elmyra Ricks E Atziry Baranski 07/18/2020, 4:09 PM

## 2020-07-18 NOTE — MAU Note (Addendum)
Pt stated she has been taking her b/p last night and it was elevated. 140's-150's/90's. reports headache started this morning denies any visual changes.  Reports fetal movement a little less than normal. Also c/o some SOB even when sitting.

## 2020-07-19 LAB — CULTURE, BETA STREP (GROUP B ONLY): Strep Gp B Culture: NEGATIVE

## 2020-07-20 LAB — CULTURE, OB URINE

## 2020-07-21 ENCOUNTER — Encounter: Payer: Self-pay | Admitting: *Deleted

## 2020-07-21 ENCOUNTER — Other Ambulatory Visit: Payer: Self-pay

## 2020-07-21 ENCOUNTER — Ambulatory Visit (INDEPENDENT_AMBULATORY_CARE_PROVIDER_SITE_OTHER): Payer: BC Managed Care – PPO | Admitting: *Deleted

## 2020-07-21 VITALS — BP 126/84 | HR 82 | Ht 63.0 in | Wt 179.7 lb

## 2020-07-21 DIAGNOSIS — Z0130 Encounter for examination of blood pressure without abnormal findings: Secondary | ICD-10-CM

## 2020-07-21 NOTE — Progress Notes (Signed)
Pt here for BP check - 126/84, P - 82. Pt was seen @ MAU on 2/25 with elevated BP and H/A. Her pre-e labs were normal @ that time. Pt reports that over the last couple days she has had some elevated systolic readings in the 290'R however has had no additional H/A. She denies visual disturbances. Pt was advised to continue to check BP regularly and to return to MAU of she develops H/A or visual changes in conjunction with elevated BP. I asked pt to bring her BP cuff to next office visit on 3/9 in order to check accuracy of BP values. She voiced understanding of information and instructions given.

## 2020-07-21 NOTE — Progress Notes (Signed)
Agree with A & P. 

## 2020-07-30 ENCOUNTER — Ambulatory Visit: Payer: BC Managed Care – PPO | Admitting: Certified Nurse Midwife

## 2020-07-30 ENCOUNTER — Other Ambulatory Visit: Payer: Self-pay

## 2020-07-30 ENCOUNTER — Other Ambulatory Visit (INDEPENDENT_AMBULATORY_CARE_PROVIDER_SITE_OTHER): Payer: BC Managed Care – PPO | Admitting: Advanced Practice Midwife

## 2020-07-30 ENCOUNTER — Inpatient Hospital Stay (HOSPITAL_COMMUNITY)
Admission: AD | Admit: 2020-07-30 | Discharge: 2020-08-03 | DRG: 807 | Disposition: A | Discharge status: Home / Self Care | Payer: BC Managed Care – PPO | Attending: Family Medicine | Admitting: Family Medicine

## 2020-07-30 ENCOUNTER — Encounter (HOSPITAL_COMMUNITY): Payer: Self-pay | Admitting: Obstetrics and Gynecology

## 2020-07-30 VITALS — BP 131/96 | HR 71 | Wt 181.2 lb

## 2020-07-30 DIAGNOSIS — Z3A38 38 weeks gestation of pregnancy: Secondary | ICD-10-CM

## 2020-07-30 DIAGNOSIS — O322XX0 Maternal care for transverse and oblique lie, not applicable or unspecified: Secondary | ICD-10-CM | POA: Diagnosis present

## 2020-07-30 DIAGNOSIS — Z9079 Acquired absence of other genital organ(s): Secondary | ICD-10-CM

## 2020-07-30 DIAGNOSIS — Z20822 Contact with and (suspected) exposure to covid-19: Secondary | ICD-10-CM | POA: Diagnosis present

## 2020-07-30 DIAGNOSIS — O1414 Severe pre-eclampsia complicating childbirth: Principal | ICD-10-CM | POA: Diagnosis present

## 2020-07-30 DIAGNOSIS — Z3403 Encounter for supervision of normal first pregnancy, third trimester: Secondary | ICD-10-CM

## 2020-07-30 DIAGNOSIS — O133 Gestational [pregnancy-induced] hypertension without significant proteinuria, third trimester: Secondary | ICD-10-CM

## 2020-07-30 DIAGNOSIS — O326XX0 Maternal care for compound presentation, not applicable or unspecified: Secondary | ICD-10-CM | POA: Diagnosis not present

## 2020-07-30 DIAGNOSIS — Z8759 Personal history of other complications of pregnancy, childbirth and the puerperium: Secondary | ICD-10-CM | POA: Diagnosis present

## 2020-07-30 DIAGNOSIS — O134 Gestational [pregnancy-induced] hypertension without significant proteinuria, complicating childbirth: Secondary | ICD-10-CM | POA: Diagnosis present

## 2020-07-30 DIAGNOSIS — O139 Gestational [pregnancy-induced] hypertension without significant proteinuria, unspecified trimester: Secondary | ICD-10-CM | POA: Diagnosis present

## 2020-07-30 DIAGNOSIS — Z90721 Acquired absence of ovaries, unilateral: Secondary | ICD-10-CM

## 2020-07-30 HISTORY — DX: Malignant (primary) neoplasm, unspecified: C80.1

## 2020-07-30 HISTORY — DX: Gestational (pregnancy-induced) hypertension without significant proteinuria, unspecified trimester: O13.9

## 2020-07-30 HISTORY — DX: Nausea with vomiting, unspecified: R11.2

## 2020-07-30 HISTORY — DX: Essential (primary) hypertension: I10

## 2020-07-30 HISTORY — DX: Other specified postprocedural states: Z98.890

## 2020-07-30 LAB — COMPREHENSIVE METABOLIC PANEL
ALT: 19 U/L (ref 0–44)
AST: 27 U/L (ref 15–41)
Albumin: 3 g/dL — ABNORMAL LOW (ref 3.5–5.0)
Alkaline Phosphatase: 173 U/L — ABNORMAL HIGH (ref 38–126)
Anion gap: 9 (ref 5–15)
BUN: 7 mg/dL (ref 6–20)
CO2: 20 mmol/L — ABNORMAL LOW (ref 22–32)
Calcium: 9.3 mg/dL (ref 8.9–10.3)
Chloride: 108 mmol/L (ref 98–111)
Creatinine, Ser: 0.61 mg/dL (ref 0.44–1.00)
GFR, Estimated: >60 mL/min (ref >60)
Glucose, Bld: 84 mg/dL (ref 70–99)
Potassium: 3.8 mmol/L (ref 3.5–5.1)
Sodium: 137 mmol/L (ref 135–145)
Total Bilirubin: 0.4 mg/dL (ref 0.3–1.2)
Total Protein: 6.4 g/dL — ABNORMAL LOW (ref 6.5–8.1)

## 2020-07-30 LAB — CBC
HCT: 39.4 % (ref 36.0–46.0)
Hemoglobin: 13.4 g/dL (ref 12.0–15.0)
MCH: 29.8 pg (ref 26.0–34.0)
MCHC: 34 g/dL (ref 30.0–36.0)
MCV: 87.6 fL (ref 80.0–100.0)
Platelets: 194 10*3/uL (ref 150–400)
RBC: 4.5 MIL/uL (ref 3.87–5.11)
RDW: 13.7 % (ref 11.5–15.5)
WBC: 8.5 10*3/uL (ref 4.0–10.5)
nRBC: 0 % (ref 0.0–0.2)

## 2020-07-30 LAB — POCT URINALYSIS DIP (DEVICE)
Bilirubin Urine: NEGATIVE
Glucose, UA: NEGATIVE mg/dL
Hgb urine dipstick: NEGATIVE
Ketones, ur: NEGATIVE mg/dL
Nitrite: NEGATIVE
Protein, ur: NEGATIVE mg/dL
Specific Gravity, Urine: 1.025 (ref 1.005–1.030)
Urobilinogen, UA: 0.2 mg/dL (ref 0.0–1.0)
pH: 6 (ref 5.0–8.0)

## 2020-07-30 LAB — TYPE AND SCREEN
ABO/RH(D): O POS
Antibody Screen: NEGATIVE

## 2020-07-30 LAB — PROTEIN / CREATININE RATIO, URINE
Creatinine, Urine: 69.83 mg/dL
Protein Creatinine Ratio: 0.26 mg/mg{Cre} — ABNORMAL HIGH (ref 0.00–0.15)
Total Protein, Urine: 18 mg/dL

## 2020-07-30 LAB — SARS CORONAVIRUS 2 (TAT 6-24 HRS): SARS Coronavirus 2: NEGATIVE

## 2020-07-30 MED ORDER — LACTATED RINGERS IV SOLN
INTRAVENOUS | Status: DC
Start: 2020-07-30 — End: 2020-08-01

## 2020-07-30 MED ORDER — FENTANYL CITRATE (PF) 100 MCG/2ML IJ SOLN
100.0000 ug | INTRAMUSCULAR | Status: DC | PRN
  Administered 2020-07-31 (×2): 100 ug via INTRAVENOUS
  Filled 2020-07-30 (×2): qty 2

## 2020-07-30 MED ORDER — ACETAMINOPHEN 325 MG PO TABS: 650.0000 mg | ORAL_TABLET | ORAL | Status: DC | PRN

## 2020-07-30 MED ORDER — OXYCODONE-ACETAMINOPHEN 5-325 MG PO TABS: 1.0000 | ORAL_TABLET | ORAL | Status: DC | PRN

## 2020-07-30 MED ORDER — MISOPROSTOL 50MCG HALF TABLET
ORAL_TABLET | ORAL | Status: AC
  Administered 2020-07-30: 50 ug
  Filled 2020-07-30: qty 1

## 2020-07-30 MED ORDER — ONDANSETRON HCL 4 MG/2ML IJ SOLN
4.0000 mg | Freq: Four times a day (QID) | INTRAMUSCULAR | Status: DC | PRN
  Administered 2020-07-31: 4 mg via INTRAVENOUS
  Filled 2020-07-30 (×2): qty 2

## 2020-07-30 MED ORDER — MISOPROSTOL 50MCG HALF TABLET
50.0000 ug | ORAL_TABLET | Freq: Once | ORAL | Status: AC
  Administered 2020-07-31: 50 ug via ORAL
  Filled 2020-07-30: qty 1

## 2020-07-30 MED ORDER — OXYTOCIN-SODIUM CHLORIDE 30-0.9 UT/500ML-% IV SOLN
2.5000 [IU]/h | INTRAVENOUS | Status: DC
  Filled 2020-07-30: qty 500

## 2020-07-30 MED ORDER — TERBUTALINE SULFATE 1 MG/ML IJ SOLN: 0.2500 mg | Freq: Once | INTRAMUSCULAR | Status: DC | PRN

## 2020-07-30 MED ORDER — LIDOCAINE HCL (PF) 1 % IJ SOLN: 30.0000 mL | INTRAMUSCULAR | Status: DC | PRN

## 2020-07-30 MED ORDER — OXYCODONE-ACETAMINOPHEN 5-325 MG PO TABS: 2.0000 | ORAL_TABLET | ORAL | Status: DC | PRN

## 2020-07-30 MED ORDER — LACTATED RINGERS IV SOLN: 500.0000 mL | INTRAVENOUS | Status: DC | PRN

## 2020-07-30 MED ORDER — MISOPROSTOL 25 MCG QUARTER TABLET
25.0000 ug | ORAL_TABLET | ORAL | Status: DC | PRN
  Administered 2020-07-30 – 2020-07-31 (×2): 25 ug via VAGINAL
  Filled 2020-07-30 (×2): qty 1

## 2020-07-30 MED ORDER — ZOLPIDEM TARTRATE 5 MG PO TABS: 5.0000 mg | ORAL_TABLET | Freq: Every evening | ORAL | Status: DC | PRN

## 2020-07-30 MED ORDER — SOD CITRATE-CITRIC ACID 500-334 MG/5ML PO SOLN: 30.0000 mL | ORAL | Status: DC | PRN

## 2020-07-30 MED ORDER — OXYTOCIN BOLUS FROM INFUSION
333.0000 mL | Freq: Once | INTRAVENOUS | Status: AC
  Administered 2020-08-01: 333 mL via INTRAVENOUS

## 2020-07-30 MED ORDER — ACETAMINOPHEN 500 MG PO TABS
1000.0000 mg | ORAL_TABLET | Freq: Once | ORAL | Status: AC
  Administered 2020-07-30: 1000 mg via ORAL
  Filled 2020-07-30: qty 2

## 2020-07-30 NOTE — Progress Notes (Signed)
Pt reports ongoing headaches for the past few days. Reports improvement with Tylenol. BP at home 07/28/20: 142/78 and 07/29/20: 138/89. BP today is: 131/96. Endorses headache 8/10 currently. Bilateral pitting edema to lower legs. Denies vision changes.   Apolonio Schneiders RN 07/30/20

## 2020-07-30 NOTE — Progress Notes (Signed)
Patient ID: Shannon Lyons, female   DOB: [birth year], 25 y.o.   MRN: [id number]  Doing well; has received first dose of vag cytotec  BP 148/90, P 82 FHR 140s +accels no decels, Cat 1 Irreg mild ctx Cx at cytotec placement: closed/thick/vtx -3  Urine P/C ratio: 0.26 Neg pre-e bloodwork  IUP@38 .2wks gHTN Cx unfavorable  Continue cervical ripening; reviewed with pt the IOL process and next steps (continuing cytotec and then attempting FB later); Pit and AROM prn; anticipate vag del  Myrtis Ser [facility] 07/30/2020

## 2020-07-30 NOTE — Progress Notes (Signed)
Notified by [facility] that patient presented with elevated BP and complaint of headache this afternoon. Now meets minimum criteria for GHTN. Discussed with Dr. Si Raider who agrees patient is candidate for direction admission. Orders placed for Cytotec. Report called to Dr. Berniece Andreas.  Mallie Snooks, MSN, CNM Certified Nurse Midwife, [facility] for [employer], [facility] [place] [facility] 07/30/20 4:24 PM

## 2020-07-30 NOTE — H&P (Signed)
OBSTETRIC ADMISSION HISTORY AND PHYSICAL  Shannon Lyons is a 25 y.o. female G1P0000 with IUP at [redacted]w[redacted]d by LMP presenting for IOL for gHTN. She has reported headaches and elevated blood pressures at home for the past few days. She reports +FMs, No LOF, no VB, no blurry vision, no RUQ pain.  She plans on breast feeding. She request nexplanon outpatient for birth control. She received her prenatal care at [facility].  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 Cancer ([place])   . Hypertension   . PONV (postoperative nausea and vomiting)   . Pregnancy induced hypertension   . Pyelonephritis 02/2015  . UTI (lower urinary tract infection)     Past Surgical History: Past Surgical History:  Procedure Laterality Date  . TUMOR REMOVAL  2012   tumor removed from near right ovary; ovary and tube removed per pt    Obstetrical History: OB History    Gravida  1   Para  0   Term  0   Preterm  0   AB  0   Living  0     SAB  0   IAB  0   Ectopic  0   Multiple  0   Live Births  0           Social History Social History   Socioeconomic History  . Marital status: Significant Other    Spouse name: Not on file  . Number of children: Not on file  . Years of education: Not on file  . Highest education level: Not on file  Occupational History  . Occupation: Diplomatic Services operational officer  Tobacco Use  . Smoking status: Never Smoker  . Smokeless tobacco: Never Used  Vaping Use  . Vaping Use: Never used  Substance and Sexual Activity  . Alcohol use: Not Currently  . Drug use: No  . Sexual activity: Yes    Birth control/protection: None  Other Topics Concern  .  Not on file  Social History Narrative  . Not on file   Social Determinants of Health   Financial Resource Strain: Not on file  Food Insecurity: No Food Insecurity  . Worried About Charity fundraiser in the Last Year: Never true  . Ran Out of Food in the Last Year: Never true  Transportation Needs: No Transportation Needs  . Lack of Transportation (Medical): No  . Lack of Transportation (Non-Medical): No  Physical Activity: Not on file  Stress: Not on file  Social Connections: Not on file    Family History: Family History  Problem Relation Age of Onset  . Diabetes Maternal Grandfather   . Cancer Paternal Grandfather   . Diabetes Maternal Uncle     Allergies: No Known Allergies  Medications Prior to Admission  Medication Sig Dispense Refill Last Dose  . Prenatal Vit-Fe Fumarate-FA (PRENATAL PO) Take by mouth.        Review of Systems   All systems reviewed and negative except as stated in HPI  Blood pressure 139/75, pulse (!) 53, temperature 98.2 F (36.8 C), temperature source Oral, resp. rate 18, height 5\' 3"  (1.6 m), weight 83.4 kg, last menstrual period 11/05/2019. General appearance: alert, cooperative  and appears stated age Lungs: normal effort  Heart: regular rate and rhythm Abdomen: soft, non-tender; bowel sounds normal Extremities: Homans sign is negative, no sign of DVT Presentation: cephalic Fetal monitoring baseline 145, mod variability, pos accels, no decels  Uterine activityNone Dilation: Closed Effacement (%): Thick Station: -3 Exam by:: Cassie Freer, RN   Prenatal labs: ABO, Rh: --/--/O POS (03/09 1836) Antibody: NEG (03/09 1836) Rubella: 5.87 (08/30 1437) RPR: Non Reactive (12/23 0820)  HBsAg: Negative (08/30 1437)  HIV: Non Reactive (12/23 0820)  GBS: Negative/-- (02/22 1622)  2 hr Glucola normal Genetic screening  Low risk Anatomy US normal   Prenatal Transfer Tool  Maternal Diabetes: No Genetic Screening: Normal Maternal  Ultrasounds/Referrals: Normal Fetal Ultrasounds or other Referrals:  None Maternal Substance Abuse:  No Significant Maternal Medications:  None Significant Maternal Lab Results: Group B Strep negative  Results for orders placed or performed during the hospital encounter of 07/30/20 (from the past 24 hour(s))  SARS CORONAVIRUS 2 (TAT 6-24 HRS) Nasopharyngeal Nasopharyngeal Swab   Collection Time: 07/30/20  6:02 PM   Specimen: Nasopharyngeal Swab  Result Value Ref Range   SARS Coronavirus 2 NEGATIVE NEGATIVE  CBC   Collection Time: 07/30/20  6:30 PM  Result Value Ref Range   WBC 8.5 4.0 - 10.5 K/uL   RBC 4.50 3.87 - 5.11 MIL/uL   Hemoglobin 13.4 12.0 - 15.0 g/dL   HCT 39.4 36.0 - 46.0 %   MCV 87.6 80.0 - 100.0 fL   MCH 29.8 26.0 - 34.0 pg   MCHC 34.0 30.0 - 36.0 g/dL   RDW 13.7 11.5 - 15.5 %   Platelets 194 150 - 400 K/uL   nRBC 0.0 0.0 - 0.2 %  Comprehensive metabolic panel   Collection Time: 07/30/20  6:30 PM  Result Value Ref Range   Sodium 137 135 - 145 mmol/L   Potassium 3.8 3.5 - 5.1 mmol/L   Chloride 108 98 - 111 mmol/L   CO2 20 (L) 22 - 32 mmol/L   Glucose, Bld 84 70 - 99 mg/dL   BUN 7 6 - 20 mg/dL   Creatinine, Ser 0.61 0.44 - 1.00 mg/dL   Calcium 9.3 8.9 - 10.3 mg/dL   Total Protein 6.4 (L) 6.5 - 8.1 g/dL   Albumin 3.0 (L) 3.5 - 5.0 g/dL   AST 27 15 - 41 U/L   ALT 19 0 - 44 U/L   Alkaline Phosphatase 173 (H) 38 - 126 U/L   Total Bilirubin 0.4 0.3 - 1.2 mg/dL   GFR, Estimated >60 >60 mL/min   Anion gap 9 5 - 15  Type and screen   Collection Time: 07/30/20  6:36 PM  Result Value Ref Range   ABO/RH(D) O POS    Antibody Screen NEG    Sample Expiration      08/02/2020,2359 Performed at [facility], [address] [place], [place] [address]   Protein / creatinine ratio, urine   Collection Time: 07/30/20  8:45 PM  Result Value Ref Range   Creatinine, Urine 69.83 mg/dL   Total Protein, Urine 18 mg/dL   Protein Creatinine Ratio 0.26 (H) 0.00 - 0.15  mg/mg[Cre]  Results for orders placed or performed in visit on 07/30/20 (from the past 24 hour(s))  POCT urinalysis dip (device)   Collection Time: 07/30/20  4:06 PM  Result Value Ref Range   Glucose, UA NEGATIVE NEGATIVE mg/dL   Bilirubin Urine NEGATIVE NEGATIVE   Ketones, ur NEGATIVE NEGATIVE mg/dL  Specific Gravity, Urine 1.025 1.005 - 1.030   Hgb urine dipstick NEGATIVE NEGATIVE   pH 6.0 5.0 - 8.0   Protein, ur NEGATIVE NEGATIVE mg/dL   Urobilinogen, UA 0.2 0.0 - 1.0 mg/dL   Nitrite NEGATIVE NEGATIVE   Leukocytes,Ua SMALL (A) NEGATIVE    Patient Active Problem List   Diagnosis Date Noted  . Gestational hypertension 07/30/2020  . History of salpingo-oophorectomy 01/21/2020  . History of pyelonephritis 01/21/2020  . Supervision of low-risk first pregnancy 01/07/2020  . 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 G1P0000 at [redacted]w[redacted]d here for IOL for gHTN.   #Labor: will plan for cytotec and FB  #gHTN: new diagnosis. PEC labs pending.    #Pain: Per patient request  #FWB: Cat I  #ID:  gbs neg  #MOF: breast #MOC:nexplanon OP   Myrtis Ser, CNM  07/30/2020, 11:24 PM

## 2020-07-30 NOTE — Progress Notes (Signed)
RN reported elevated BP with edema and headache. Called L&D team, patient to go to MAU for admission and then be admitted to L&D for IOL for gHTN. Pt aware and is going home to pick up hospital bag and FOB.   Gaylan Gerold, CNM, MSN, [place] Certified Nurse Midwife, [facility]

## 2020-07-31 ENCOUNTER — Inpatient Hospital Stay (HOSPITAL_COMMUNITY): Payer: BC Managed Care – PPO | Admitting: Anesthesiology

## 2020-07-31 ENCOUNTER — Encounter (HOSPITAL_COMMUNITY): Payer: Self-pay | Admitting: Obstetrics and Gynecology

## 2020-07-31 LAB — CBC
HCT: 38.2 % (ref 36.0–46.0)
Hemoglobin: 13.5 g/dL (ref 12.0–15.0)
MCH: 30.1 pg (ref 26.0–34.0)
MCHC: 35.3 g/dL (ref 30.0–36.0)
MCV: 85.3 fL (ref 80.0–100.0)
Platelets: 193 10*3/uL (ref 150–400)
RBC: 4.48 MIL/uL (ref 3.87–5.11)
RDW: 13.7 % (ref 11.5–15.5)
WBC: 12.1 10*3/uL — ABNORMAL HIGH (ref 4.0–10.5)
nRBC: 0 % (ref 0.0–0.2)

## 2020-07-31 LAB — RPR: RPR Ser Ql: NONREACTIVE

## 2020-07-31 MED ORDER — OXYTOCIN-SODIUM CHLORIDE 30-0.9 UT/500ML-% IV SOLN
1.0000 m[IU]/min | INTRAVENOUS | Status: DC
  Administered 2020-07-31: 2 m[IU]/min via INTRAVENOUS
  Filled 2020-07-31: qty 500

## 2020-07-31 MED ORDER — LIDOCAINE HCL (PF) 1 % IJ SOLN
INTRAMUSCULAR | Status: DC | PRN
  Administered 2020-07-31: 5 mL via EPIDURAL

## 2020-07-31 MED ORDER — PHENYLEPHRINE 40 MCG/ML (10ML) SYRINGE FOR IV PUSH (FOR BLOOD PRESSURE SUPPORT): 80.0000 ug | PREFILLED_SYRINGE | INTRAVENOUS | Status: DC | PRN

## 2020-07-31 MED ORDER — EPHEDRINE 5 MG/ML INJ: 10.0000 mg | INTRAVENOUS | Status: DC | PRN

## 2020-07-31 MED ORDER — MAGNESIUM SULFATE BOLUS VIA INFUSION
4.0000 g | Freq: Once | INTRAVENOUS | Status: AC
  Administered 2020-07-31: 4 g via INTRAVENOUS
  Filled 2020-07-31: qty 1000

## 2020-07-31 MED ORDER — LACTATED RINGERS IV SOLN
500.0000 mL | Freq: Once | INTRAVENOUS | Status: AC
  Administered 2020-07-31: 250 mL via INTRAVENOUS

## 2020-07-31 MED ORDER — LABETALOL HCL 5 MG/ML IV SOLN: 80.0000 mg | INTRAVENOUS | Status: DC | PRN

## 2020-07-31 MED ORDER — FENTANYL-BUPIVACAINE-NACL 0.5-0.125-0.9 MG/250ML-% EP SOLN
12.0000 mL/h | EPIDURAL | Status: DC | PRN
  Filled 2020-07-31: qty 250

## 2020-07-31 MED ORDER — LABETALOL HCL 5 MG/ML IV SOLN
20.0000 mg | INTRAVENOUS | Status: DC | PRN
  Administered 2020-07-31 – 2020-08-01 (×2): 20 mg via INTRAVENOUS
  Filled 2020-07-31 (×2): qty 4

## 2020-07-31 MED ORDER — TERBUTALINE SULFATE 1 MG/ML IJ SOLN: 0.2500 mg | Freq: Once | INTRAMUSCULAR | Status: DC | PRN

## 2020-07-31 MED ORDER — HYDRALAZINE HCL 20 MG/ML IJ SOLN: 10.0000 mg | INTRAMUSCULAR | Status: DC | PRN

## 2020-07-31 MED ORDER — DIPHENHYDRAMINE HCL 50 MG/ML IJ SOLN: 12.5000 mg | INTRAMUSCULAR | Status: DC | PRN

## 2020-07-31 MED ORDER — MAGNESIUM SULFATE 40 GM/1000ML IV SOLN
2.0000 g/h | INTRAVENOUS | Status: AC
  Administered 2020-07-31 – 2020-08-01 (×2): 2 g/h via INTRAVENOUS
  Filled 2020-07-31 (×2): qty 1000

## 2020-07-31 MED ORDER — FENTANYL-BUPIVACAINE-NACL 0.5-0.125-0.9 MG/250ML-% EP SOLN
EPIDURAL | Status: DC | PRN
  Administered 2020-07-31: 12 mL/h via EPIDURAL

## 2020-07-31 MED ORDER — MISOPROSTOL 50MCG HALF TABLET
ORAL_TABLET | ORAL | Status: AC
  Filled 2020-07-31: qty 1

## 2020-07-31 MED ORDER — LABETALOL HCL 5 MG/ML IV SOLN: 40.0000 mg | INTRAVENOUS | Status: DC | PRN

## 2020-07-31 NOTE — Progress Notes (Signed)
Patient ID: Shannon Lyons, female   DOB: Dec 28, 1995, 25 y.o.   MRN: [id number]  S/p cytotec x 3 doses (alternating routes); feeling a little crampy; got a little rest during the night; took a dose of 1gm Tylenol for a mild/mod H/A last evening that is better now  BP 144/79, P 60 FHR 145-150s, +accels, no decels, Cat 1 Irreg mild ctx Cx FT/50/soft/vtx -3  IUP@38 .3wks gHTN Cx unfavorable  Cervical foley attempted without success due to pt's discomfort; 4th cytotec dose placed vaginally and will revisit the foley placement later today  Myrtis Ser CNM 07/31/2020 8:00 AM

## 2020-07-31 NOTE — Progress Notes (Signed)
Labor Progress Note Ziyan Hillmer is a 25 y.o. G1P0000 at [redacted]w[redacted]d presented for IOL for gHTN.  S: Feeling fine   O:  BP (!) 155/87 (BP Location: Right Arm)   Pulse 69   Temp 98.9 F (37.2 C)   Resp 16   Ht 5\' 3"  (1.6 m)   Wt 83.4 kg   LMP 11/05/2019   BMI 32.58 kg/m  EFM: baseline 155/mod variability/pos accels/no decels   CVE: Dilation: 2 Effacement (%): 50 Cervical Position: Posterior Station: -2 Presentation: Vertex Exam by:: Dr. Lowell Guitar, Dr. Berniece Andreas   A&P: 25 y.o. G1P0000 [redacted]w[redacted]d here for IOL for gHTN #Labor: Progressing well. S/p cytotec x4. Placed FB and large amounts of dark red blood came out catheter - subsequently removed and monitored w improvement in bleeding. Cat I tracing throughout. Will monitor and start pitocin in one hour if bleeding stable.   #Pain: per patient request #FWB: Cat I  #GBS negative  #gHTN: PEC labs normal. UPC .26. mild range pressures. Continue to monitor.   Janet Berlin, MD 11:53 AM

## 2020-07-31 NOTE — Progress Notes (Signed)
Shannon Lyons is a 25 y.o. G1P0000 at [redacted]w[redacted]d by LMP admitted for induction of labor due to gestational h  ypertension.  Subjective:   Objective: BP (!) 144/90   Pulse (!) 59   Temp 98 F (36.7 C) (Axillary)   Resp 17   Ht 5\' 3"  (1.6 m)   Wt 83.4 kg   LMP 11/05/2019   BMI 32.58 kg/m  No intake/output data recorded. Total I/O In: 526.5 [P.O.:360; I.V.:166.5] Out: -   FHT:  FHR: 140 bpm, variability: moderate,  accelerations:  Present,  decelerations:  Absent UC:   regular, every 3 minutes SVE:   Dilation: 2.5 Effacement (%): 50 Station: -3 Exam by:: Mary Martinique Johnson, RN  Labs: Lab Results  Component Value Date   WBC 8.5 07/30/2020   HGB 13.4 07/30/2020   HCT 39.4 07/30/2020   MCV 87.6 07/30/2020   PLT 194 07/30/2020    Assessment / Plan: Spontaneous labor, progressing normally  Labor: Progressing normally Preeclampsia:  labs stable Fetal Wellbeing:  Category I Pain Control:  Labor support without medications, would like epidural as option  I/D:  n/a Anticipated MOD:  NSVD  Hermenia Bers Muus 07/31/2020, 4:23 PM

## 2020-07-31 NOTE — Anesthesia Preprocedure Evaluation (Signed)
Anesthesia Evaluation  Patient identified by MRN, date of birth, ID band Patient awake    Reviewed: Allergy & Precautions, Patient's Chart, lab work & pertinent test results  Airway Mallampati: II  TM Distance: >3 FB Neck ROM: Full    Dental no notable dental hx. (+) Teeth Intact, Dental Advisory Given   Pulmonary neg pulmonary ROS,    Pulmonary exam normal breath sounds clear to auscultation       Cardiovascular Exercise Tolerance: Good hypertension (gHtn), Pt. on medications Normal cardiovascular exam Rhythm:Regular Rate:Normal     Neuro/Psych negative neurological ROS  negative psych ROS   GI/Hepatic negative GI ROS, Neg liver ROS,   Endo/Other  negative endocrine ROS  Renal/GU negative Renal ROS     Musculoskeletal negative musculoskeletal ROS (+)   Abdominal   Peds  Hematology Lab Results      Component                Value               Date                      WBC                      12.1 (H)            07/31/2020                HGB                      13.5                07/31/2020                HCT                      38.2                07/31/2020                MCV                      85.3                07/31/2020                PLT                      193                 07/31/2020              Anesthesia Other Findings   Reproductive/Obstetrics (+) Pregnancy                             Anesthesia Physical Anesthesia Plan  ASA: III  Anesthesia Plan: Epidural   Post-op Pain Management:    Induction:   PONV Risk Score and Plan:   Airway Management Planned:   Additional Equipment:   Intra-op Plan:   Post-operative Plan:   Informed Consent: I have reviewed the patients History and Physical, chart, labs and discussed the procedure including the risks, benefits and alternatives for the proposed anesthesia with the patient or authorized representative who  has indicated his/her understanding and acceptance.       Plan Discussed with: CRNA  and Anesthesiologist  Anesthesia Plan Comments: (38.3 wk G1P0 for LEA)       Anesthesia Quick Evaluation

## 2020-07-31 NOTE — Anesthesia Procedure Notes (Signed)
Epidural Patient location during procedure: OB Start time: 07/31/2020 9:13 PM End time: 07/31/2020 9:29 PM  Staffing Anesthesiologist: Barnet Glasgow, MD Performed: anesthesiologist   Preanesthetic Checklist Completed: patient identified, IV checked, site marked, risks and benefits discussed, surgical consent, monitors and equipment checked, pre-op evaluation and timeout performed  Epidural Patient position: sitting Prep: DuraPrep and site prepped and draped Patient monitoring: continuous pulse ox and blood pressure Approach: midline Location: L3-L4 Injection technique: LOR air  Needle:  Needle type: Tuohy  Needle gauge: 17 G Needle length: 9 cm and 9 Needle insertion depth: 6 cm Catheter type: closed end flexible Catheter size: 19 Gauge Catheter at skin depth: 12 cm Test dose: negative  Assessment Events: blood not aspirated, injection not painful, no injection resistance, no paresthesia and negative IV test  Additional Notes Patient identified. Risks/Benefits/Options discussed with patient including but not limited to bleeding, infection, nerve damage, paralysis, failed block, incomplete pain control, headache, blood pressure changes, nausea, vomiting, reactions to medication both or allergic, itching and postpartum back pain. Confirmed with bedside nurse the patient's most recent platelet count. Confirmed with patient that they are not currently taking any anticoagulation, have any bleeding history or any family history of bleeding disorders. Patient expressed understanding and wished to proceed. All questions were answered. Sterile technique was used throughout the entire procedure. Please see nursing notes for vital signs. Test dose was given through epidural needle and negative prior to continuing to dose epidural or start infusion. Warning signs of high block given to the patient including shortness of breath, tingling/numbness in hands, complete motor block, or any  concerning symptoms with instructions to call for help. Patient was given instructions on fall risk and not to get out of bed. All questions and concerns addressed with instructions to call with any issues.  1 Attempt (S) . Patient tolerated procedure well.

## 2020-07-31 NOTE — Plan of Care (Signed)
  Problem: Education: Goal: Knowledge of Childbirth will improve Outcome: Progressing Goal: Ability to make informed decisions regarding treatment and plan of care will improve Outcome: Progressing Goal: Ability to state and carry out methods to decrease the pain will improve Outcome: Progressing Goal: Individualized Educational Video(s) Outcome: Progressing   Problem: Coping: Goal: Ability to verbalize concerns and feelings about labor and delivery will improve Outcome: Progressing   Problem: Life Cycle: Goal: Ability to make normal progression through stages of labor will improve Outcome: Progressing Goal: Ability to effectively push during vaginal delivery will improve Outcome: Progressing   Problem: Role Relationship: Goal: Will demonstrate positive interactions with the child Outcome: Progressing   Problem: Safety: Goal: Risk of complications during labor and delivery will decrease Outcome: Progressing   Problem: Pain Management: Goal: Relief or control of pain from uterine contractions will improve Outcome: Progressing   Problem: Education: Goal: Knowledge of General Education information will improve Description: Including pain rating scale, medication(s)/side effects and non-pharmacologic comfort measures Outcome: Progressing   Problem: Health Behavior/Discharge Planning: Goal: Ability to manage health-related needs will improve Outcome: Progressing   Problem: Clinical Measurements: Goal: Ability to maintain clinical measurements within normal limits will improve Outcome: Progressing Goal: Will remain free from infection Outcome: Progressing Goal: Diagnostic test results will improve Outcome: Progressing Goal: Respiratory complications will improve Outcome: Progressing Goal: Cardiovascular complication will be avoided Outcome: Progressing   Problem: Activity: Goal: Risk for activity intolerance will decrease Outcome: Progressing   Problem:  Nutrition: Goal: Adequate nutrition will be maintained Outcome: Progressing   Problem: Coping: Goal: Level of anxiety will decrease Outcome: Progressing   Problem: Elimination: Goal: Will not experience complications related to bowel motility Outcome: Progressing Goal: Will not experience complications related to urinary retention Outcome: Progressing   Problem: Pain Managment: Goal: General experience of comfort will improve Outcome: Progressing   Problem: Safety: Goal: Ability to remain free from injury will improve Outcome: Progressing   Problem: Skin Integrity: Goal: Risk for impaired skin integrity will decrease Outcome: Progressing   Problem: Education: Goal: Knowledge of disease or condition will improve Outcome: Progressing Goal: Knowledge of the prescribed therapeutic regimen will improve Outcome: Progressing   Problem: Fluid Volume: Goal: Peripheral tissue perfusion will improve Outcome: Progressing   Problem: Clinical Measurements: Goal: Complications related to disease process, condition or treatment will be avoided or minimized Outcome: Progressing   

## 2020-07-31 NOTE — Progress Notes (Signed)
Patient ID: Shannon Lyons, female   DOB: 1996-05-09, 25 y.o.   MRN: [id number] Getting epidural Had 2 severe range systolic BPs just prior to Epidural, so I had them hold on giving protocol to see if BP would drop after epidural She continued to have severe range sytolics, so protocol orders put in  Vitals:   07/31/20 1858 07/31/20 1931 07/31/20 2003 07/31/20 2030  BP: (!) 149/93 (!) 142/66 (!) 149/81 (!) 146/86  Pulse: 67 (!) 56 61 74  Resp:   17   Temp:   98.5 F (36.9 C)   TempSrc:   Axillary   Weight:      Height:       FHR reactive, baseline 145 with good accels UCs q2-3 min  Cervix exam deferred  Will check when gets foley  Preeclampsia focused order set entered

## 2020-08-01 ENCOUNTER — Encounter (HOSPITAL_COMMUNITY): Payer: Self-pay | Admitting: Obstetrics and Gynecology

## 2020-08-01 DIAGNOSIS — O326XX0 Maternal care for compound presentation, not applicable or unspecified: Secondary | ICD-10-CM

## 2020-08-01 DIAGNOSIS — Z3A38 38 weeks gestation of pregnancy: Secondary | ICD-10-CM

## 2020-08-01 DIAGNOSIS — O134 Gestational [pregnancy-induced] hypertension without significant proteinuria, complicating childbirth: Secondary | ICD-10-CM

## 2020-08-01 LAB — CBC
HCT: 31 % — ABNORMAL LOW (ref 36.0–46.0)
Hemoglobin: 10.5 g/dL — ABNORMAL LOW (ref 12.0–15.0)
MCH: 30.1 pg (ref 26.0–34.0)
MCHC: 33.9 g/dL (ref 30.0–36.0)
MCV: 88.8 fL (ref 80.0–100.0)
Platelets: 181 10*3/uL (ref 150–400)
RBC: 3.49 MIL/uL — ABNORMAL LOW (ref 3.87–5.11)
RDW: 13.8 % (ref 11.5–15.5)
WBC: 21.2 10*3/uL — ABNORMAL HIGH (ref 4.0–10.5)
nRBC: 0 % (ref 0.0–0.2)

## 2020-08-01 MED ORDER — ZOLPIDEM TARTRATE 5 MG PO TABS: 5.0000 mg | ORAL_TABLET | Freq: Every evening | ORAL | Status: DC | PRN

## 2020-08-01 MED ORDER — DIBUCAINE (PERIANAL) 1 % EX OINT: 1.0000 "application " | TOPICAL_OINTMENT | CUTANEOUS | Status: DC | PRN

## 2020-08-01 MED ORDER — WITCH HAZEL-GLYCERIN EX PADS: 1.0000 "application " | MEDICATED_PAD | CUTANEOUS | Status: DC | PRN

## 2020-08-01 MED ORDER — MISOPROSTOL 200 MCG PO TABS
ORAL_TABLET | ORAL | Status: AC
  Filled 2020-08-01: qty 4

## 2020-08-01 MED ORDER — ONDANSETRON HCL 4 MG/2ML IJ SOLN: 4.0000 mg | INTRAMUSCULAR | Status: DC | PRN

## 2020-08-01 MED ORDER — ONDANSETRON HCL 4 MG PO TABS: 4.0000 mg | ORAL_TABLET | ORAL | Status: DC | PRN

## 2020-08-01 MED ORDER — TETANUS-DIPHTH-ACELL PERTUSSIS 5-2.5-18.5 LF-MCG/0.5 IM SUSY: 0.5000 mL | PREFILLED_SYRINGE | Freq: Once | INTRAMUSCULAR | Status: DC

## 2020-08-01 MED ORDER — SENNOSIDES-DOCUSATE SODIUM 8.6-50 MG PO TABS
2.0000 | ORAL_TABLET | Freq: Every day | ORAL | Status: DC
  Administered 2020-08-02 – 2020-08-03 (×2): 2 via ORAL
  Filled 2020-08-01 (×2): qty 2

## 2020-08-01 MED ORDER — BENZOCAINE-MENTHOL 20-0.5 % EX AERO
1.0000 "application " | INHALATION_SPRAY | CUTANEOUS | Status: DC | PRN
  Administered 2020-08-02: 1 via TOPICAL
  Filled 2020-08-01: qty 56

## 2020-08-01 MED ORDER — IBUPROFEN 600 MG PO TABS
600.0000 mg | ORAL_TABLET | Freq: Four times a day (QID) | ORAL | Status: DC
  Administered 2020-08-01 – 2020-08-03 (×8): 600 mg via ORAL
  Filled 2020-08-01 (×8): qty 1

## 2020-08-01 MED ORDER — LACTATED RINGERS IV SOLN: INTRAVENOUS | Status: DC

## 2020-08-01 MED ORDER — PRENATAL MULTIVITAMIN CH
1.0000 | ORAL_TABLET | Freq: Every day | ORAL | Status: DC
  Administered 2020-08-01 – 2020-08-03 (×3): 1 via ORAL
  Filled 2020-08-01 (×3): qty 1

## 2020-08-01 MED ORDER — MISOPROSTOL 200 MCG PO TABS: 200.0000 ug | ORAL_TABLET | Freq: Once | ORAL | Status: DC

## 2020-08-01 MED ORDER — SIMETHICONE 80 MG PO CHEW: 80.0000 mg | CHEWABLE_TABLET | ORAL | Status: DC | PRN

## 2020-08-01 MED ORDER — COCONUT OIL OIL
1.0000 "application " | TOPICAL_OIL | Status: DC | PRN
  Administered 2020-08-02: 1 via TOPICAL

## 2020-08-01 MED ORDER — MEDROXYPROGESTERONE ACETATE 150 MG/ML IM SUSP: 150.0000 mg | INTRAMUSCULAR | Status: DC | PRN

## 2020-08-01 MED ORDER — DIPHENHYDRAMINE HCL 25 MG PO CAPS: 25.0000 mg | ORAL_CAPSULE | Freq: Four times a day (QID) | ORAL | Status: DC | PRN

## 2020-08-01 MED ORDER — MISOPROSTOL 200 MCG PO TABS
800.0000 ug | ORAL_TABLET | Freq: Once | ORAL | Status: AC
  Administered 2020-08-01: 800 ug via RECTAL

## 2020-08-01 MED ORDER — ACETAMINOPHEN 325 MG PO TABS
650.0000 mg | ORAL_TABLET | ORAL | Status: DC | PRN
  Administered 2020-08-02: 650 mg via ORAL
  Filled 2020-08-01: qty 2

## 2020-08-01 MED ORDER — FERROUS SULFATE 325 (65 FE) MG PO TABS
325.0000 mg | ORAL_TABLET | ORAL | Status: DC
  Administered 2020-08-01 – 2020-08-03 (×2): 325 mg via ORAL
  Filled 2020-08-01 (×2): qty 1

## 2020-08-01 NOTE — Anesthesia Postprocedure Evaluation (Signed)
Anesthesia Post Note  Patient: [name]  Procedure(s) Performed: AN AD HOC LABOR EPIDURAL     Patient location during evaluation: OB High Risk Anesthesia Type: Epidural Level of consciousness: awake, awake and alert and oriented Pain management: pain level controlled Vital Signs Assessment: post-procedure vital signs reviewed and stable Respiratory status: spontaneous breathing, nonlabored ventilation and respiratory function stable Cardiovascular status: stable Postop Assessment: no headache, patient able to bend at knees, no apparent nausea or vomiting, no backache, adequate PO intake and able to ambulate Anesthetic complications: no   No complications documented.  Last Vitals:  Vitals:   08/01/20 1007 08/01/20 1224  BP: 129/68 140/79  Pulse: 94 (!) 104  Resp:  18  Temp:    SpO2:  99%    Last Pain:  Vitals:   08/01/20 1218  TempSrc:   PainSc: 5    Pain Goal: Patients Stated Pain Goal: 0 (07/31/20 2200)                 Shannon Lyons

## 2020-08-01 NOTE — Discharge Summary (Signed)
Postpartum Discharge Summary  Date of Service updated     Patient Name: Shannon Lyons DOB: May 12, 1996 MRN: [id number]  Date of admission: 07/30/2020 Delivery date:08/01/2020  Delivering provider: Hansel Feinstein  Date of discharge: 08/01/20  Admitting diagnosis: Gestational hypertension [O13.9] Intrauterine pregnancy: [redacted]w[redacted]d    Secondary diagnosis:  Active Problems:   History of salpingo-oophorectomy   Gestational hypertension  Additional problems: 1:   postpartum hemorrhage during partial separation of placenta. No hemorrhage after placenta delivered.  EBL 10043m2:   Mild shoulder dystocia due to malrestitution     Discharge diagnosis: Term Pregnancy Delivered, Preeclampsia (severe), PPH and mild shoulder dystocia                                              Post partum procedures:Magnesium Augmentation: Pitocin and Cytotec Complications: [id number] course: Induction of Labor With Vaginal Delivery   [age].o. yo G1P1001 at 3856w4ds admitted to the hospital 07/30/2020 for induction of labor.  Indication for induction: Preeclampsia.    Patient had an uncomplicated labor course as follows: She had 4 doses of Cytotec followed by Pitocin for labor induction She proceeded well with SVD, which was complicated by a mild shoulder dystocia (due to malrestitution and compound hand) and Hemorrhage (due to partial separation of placenta, with no bleeding after placenta delivered, EBL 1000m53mMembrane Rupture Time/Date: 5:12 PM ,07/31/2020   Delivery Method:Vaginal, Spontaneous  Episiotomy: None  Lacerations:  1st degree  Details of delivery can be found in separate delivery note.  Patient had a routine postpartum course. Patient is discharged home 08/01/20.  Newborn Data: Birth date:08/01/2020  Birth time:5:50 AM  Gender:Female  Living status:Living  Apgars:7 ,9  Weight:3430 g   Magnesium Sulfate received: Yes: Seizure prophylaxis BMZ received:  No Rhophylac:N/A MMR:N/A T-DaP:Given prenatally Flu: No Transfusion:No  Physical exam  Vitals:   08/01/20 0656 08/01/20 0701 08/01/20 0716 08/01/20 0731  BP: 124/73 124/84 122/77 131/78  Pulse: (!) 106 99 (!) 102 97  Resp:    18  Temp:      TempSrc:      SpO2:      Weight:      Height:       General: alert, cooperative and no distress Lochia: appropriate Uterine Fundus: firm Incision: N/A DVT Evaluation: No evidence of DVT seen on physical exam. Labs: Lab Results  Component Value Date   WBC 21.2 (H) 08/01/2020   HGB 10.5 (L) 08/01/2020   HCT 31.0 (L) 08/01/2020   MCV 88.8 08/01/2020   PLT 181 08/01/2020   CMP Latest Ref Rng & Units 07/30/2020  Glucose 70 - 99 mg/dL 84  BUN 6 - 20 mg/dL 7  Creatinine 0.44 - 1.00 mg/dL 0.61  Sodium 135 - 145 mmol/L 137  Potassium 3.5 - 5.1 mmol/L 3.8  Chloride 98 - 111 mmol/L 108  CO2 22 - 32 mmol/L 20(L)  Calcium 8.9 - 10.3 mg/dL 9.3  Total Protein 6.5 - 8.1 g/dL 6.4(L)  Total Bilirubin 0.3 - 1.2 mg/dL 0.4  Alkaline Phos 38 - 126 U/L 173(H)  AST 15 - 41 U/L 27  ALT 0 - 44 U/L 19   Edinburgh Score: No flowsheet data found.    After visit meds:  Allergies as of 08/03/2020   No Known Allergies     Medication List  TAKE these medications   amLODipine 5 MG tablet Commonly known as: NORVASC Take 1 tablet (5 mg total) by mouth daily.   ibuprofen 600 MG tablet Commonly known as: ADVIL Take 1 tablet (600 mg total) by mouth every 6 (six) hours.   PRENATAL PO Take by mouth.         Discharge home in stable condition Infant Feeding: Breast Infant Disposition:home with mother Discharge instruction: per After Visit Summary and Postpartum booklet. Activity: Advance as tolerated. Pelvic rest for 6 weeks.  Diet: routine diet Anticipated Birth Control: Nexplanon Postpartum Appointment:1 week Additional Postpartum F/U: BP check 1 week Future Appointments:No future appointments. Follow up Visit:     08/03/2020 8:56  AM  Hansel Feinstein, CNM

## 2020-08-01 NOTE — Progress Notes (Signed)
LABOR PROGRESS NOTE  Shannon Lyons is a 25 y.o. G1P0000 at [redacted]w[redacted]d admitted for IOL due to gHTN.  Subjective: Resting comfortably s/p epidural. No complaints or questions at this time.  Objective: BP (!) 152/104   Pulse (!) 107   Temp 98.6 F (37 C) (Oral)   Resp 17   Ht 5\' 3"  (1.6 m)   Wt 83.4 kg   LMP 11/05/2019   SpO2 98%   BMI 32.58 kg/m  or  Vitals:   08/01/20 0301 08/01/20 0312 08/01/20 0331 08/01/20 0400  BP:  (!) 149/99 (!) 145/90 (!) 152/104  Pulse:  (!) 106 91 (!) 107  Resp:   17   Temp: 98.6 F (37 C)     TempSrc: Oral     SpO2:      Weight:      Height:        Dilation: 10 Dilation Complete Date: 08/01/20 Dilation Complete Time: 0347 Effacement (%): 100 Cervical Position: Posterior Station: Plus 2 Presentation: Vertex Exam by:: West Pugh, RN FHT: baseline rate 135, moderate varibility, +acel, no decel Toco: q2-5 min  Labs: Lab Results  Component Value Date   WBC 12.1 (H) 07/31/2020   HGB 13.5 07/31/2020   HCT 38.2 07/31/2020   MCV 85.3 07/31/2020   PLT 193 07/31/2020    Patient Active Problem List   Diagnosis Date Noted  . Gestational hypertension 07/30/2020  . History of salpingo-oophorectomy 01/21/2020  . History of pyelonephritis 01/21/2020  . Supervision of low-risk first pregnancy 01/07/2020  . 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 G1P0000 at [redacted]w[redacted]d here for IOL due to gHTN.  IOL: s/p cytotec x4 and SROM. On pitocin 33mu/min. Patient found to be complete/100/+2 on most recent exam. Will start pushing. Pre-E w/SF:  Patient started on Mag due to multiple severe range BPs. P:C 0.26. Continue Mg Fetal Wellbeing:  Cat I strip Pain Control:  Epidural Anticipated MOD:  Vaginal  Alcus Dad, MD PGY-1 [facility]  08/01/2020, 4:28 AM

## 2020-08-01 NOTE — Lactation Note (Signed)
This note was copied from a baby's chart. Lactation Consultation Note  Patient Name: Shannon Lyons GEFUW'T Date: 08/01/2020 Age:27 hours  Mother and baby are sleeping upon visit. Encouraged support person to request [facility] services as soon as mother is awake for a lactation consult.   LC will come back to room at another time as possible.     Linels A Higuera [place] 08/01/2020, 11:26 AM

## 2020-08-01 NOTE — Lactation Note (Signed)
This note was copied from a baby's chart. Lactation Consultation Note  Patient Name: Shannon Lyons [id number] Date: 08/01/2020 Reason for consult: Initial assessment;Early term 37-38.6wks;Primapara;1st time breastfeeding Age:27 hours  Initial visit to 65 hours old ET infant of a P1 mother. Infant is sleeping in basinet upon arrival.  Talked to mother about hand expression and demonstrated technique. Infant started cueing. Colostrum easily expressed and collected ~65mL in a spoon. Demonstrated spoonfeeding.  Set up support pillows for football position to right breast. Demonstrated alignment. Latched infant with ease. Noted suckling rhythmically with flanged lips, observed swallowing and breast tissue moving. Mother denies pain or discomfort. Infant breastfed for ~10 minutes. Mother burped and left infant skin to skin. Infant had a stool and FOI changed diaper.   Plan: 1-Breastfeeding on demand, ensuring a deep, comfortable latch.  2-Offer breast 8-12 times in 24h period to establish good milk supply. 3-Undressing infant and place skin to skin when ready to breastfeed 4-Keep infant awake during breastfeeding session: massaging breast, infant's hand/shoulder/feet 5-Monitor voids and stools as signs good intake.  6-Encouraged maternal rest, hydration and food intake.  7-Contact LC as needed for feeds/support/concerns/questions   All questions answered at this time. Provided Lactation services brochure and promoted [employer] booklet information.    Maternal Data Has patient been taught Hand Expression?: Yes Does the patient have breastfeeding experience prior to this delivery?: No  Feeding Mother's Current Feeding Choice: Breast Milk  LATCH Score Latch: Grasps breast easily, tongue down, lips flanged, rhythmical sucking.  Audible Swallowing: Spontaneous and intermittent  Type of Nipple: Everted at rest and after stimulation (short shafted)  Comfort (Breast/Nipple): Soft /  non-tender  Hold (Positioning): Assistance needed to correctly position infant at breast and maintain latch.  LATCH Score: 9  Interventions Interventions: Breast feeding basics reviewed;Assisted with latch;Skin to skin;Breast massage;Hand express;Breast compression;Adjust position;Support pillows;Position options;Expressed milk;Education  Discharge Pump: Personal [place] Program: No  Consult Status Consult Status: Follow-up Date: 08/02/20 Follow-up type: In-patient    Shannon Lyons 08/01/2020, 4:56 PM

## 2020-08-02 MED ORDER — AMLODIPINE BESYLATE 5 MG PO TABS
5.0000 mg | ORAL_TABLET | Freq: Every day | ORAL | Status: DC
Start: 2020-08-02 — End: 2020-08-03
  Administered 2020-08-02 – 2020-08-03 (×2): 5 mg via ORAL
  Filled 2020-08-02 (×2): qty 1

## 2020-08-02 NOTE — Plan of Care (Signed)
  Problem: Education: Goal: Knowledge of General Education information will improve Description: Including pain rating scale, medication(s)/side effects and non-pharmacologic comfort measures Outcome: Progressing   Problem: Health Behavior/Discharge Planning: Goal: Ability to manage health-related needs will improve Outcome: Progressing   Problem: Clinical Measurements: Goal: Ability to maintain clinical measurements within normal limits will improve Outcome: Progressing Goal: Will remain free from infection Outcome: Progressing Goal: Diagnostic test results will improve Outcome: Progressing Goal: Respiratory complications will improve Outcome: Progressing Goal: Cardiovascular complication will be avoided Outcome: Progressing   Problem: Activity: Goal: Risk for activity intolerance will decrease Outcome: Progressing   Problem: Nutrition: Goal: Adequate nutrition will be maintained Outcome: Progressing   Problem: Coping: Goal: Level of anxiety will decrease Outcome: Progressing   Problem: Elimination: Goal: Will not experience complications related to bowel motility Outcome: Progressing Goal: Will not experience complications related to urinary retention Outcome: Progressing   Problem: Pain Managment: Goal: General experience of comfort will improve Outcome: Progressing   Problem: Safety: Goal: Ability to remain free from injury will improve Outcome: Progressing   Problem: Skin Integrity: Goal: Risk for impaired skin integrity will decrease Outcome: Progressing   Problem: Education: Goal: Knowledge of disease or condition will improve Outcome: Progressing Goal: Knowledge of the prescribed therapeutic regimen will improve Outcome: Progressing   Problem: Fluid Volume: Goal: Peripheral tissue perfusion will improve Outcome: Progressing   Problem: Clinical Measurements: Goal: Complications related to disease process, condition or treatment will be avoided or  minimized Outcome: Progressing   Problem: Education: Goal: Knowledge of condition will improve Outcome: Progressing Goal: Individualized Educational Video(s) Outcome: Progressing Goal: Individualized Newborn Educational Video(s) Outcome: Progressing   Problem: Activity: Goal: Will verbalize the importance of balancing activity with adequate rest periods Outcome: Progressing Goal: Ability to tolerate increased activity will improve Outcome: Progressing   Problem: Coping: Goal: Ability to identify and utilize available resources and services will improve Outcome: Progressing   Problem: Life Cycle: Goal: Chance of risk for complications during the postpartum period will decrease Outcome: Progressing   Problem: Role Relationship: Goal: Ability to demonstrate positive interaction with newborn will improve Outcome: Progressing   Problem: Skin Integrity: Goal: Demonstration of wound healing without infection will improve Outcome: Progressing

## 2020-08-02 NOTE — Progress Notes (Addendum)
Post Partum Day 1 Subjective: no complaints, up ad lib, voiding and tolerating PO  Objective: Blood pressure (!) 114/54, pulse 73, temperature 98.2 F (36.8 C), temperature source Oral, resp. rate 18, height 5\' 3"  (1.6 m), weight 83.4 kg, last menstrual period 11/05/2019, SpO2 98 %, unknown if currently breastfeeding.  Physical Exam:  General: alert, cooperative and no distress Lochia: appropriate Uterine Fundus: firm Incision:  DVT Evaluation: No evidence of DVT seen on physical exam.  Recent Labs    07/31/20 2041 08/01/20 0657  HGB 13.5 10.5*  HCT 38.2 31.0*    Assessment/Plan: Off magnesium this am Plan for discharge tomorrow and Breastfeeding  Begin Norvasc 5 mg daily   LOS: 3 days   Florian Buff 08/02/2020, 7:52 AM

## 2020-08-02 NOTE — Lactation Note (Signed)
This note was copied from a baby's chart. Lactation Consultation Note  Patient Name: Shannon Lyons [id number] Date: 08/02/2020 Reason for consult: Follow-up assessment;Primapara;1st time breastfeeding;Early term 37-38.6wks;Infant weight loss;Other (Comment) (6 % weight loss, dad holding baby and mom asleep) Age:27 hours  LC will F/U today   Maternal Data    Feeding Mother's Current Feeding Choice: Breast Milk  LATCH Score                    Lactation Tools Discussed/Used    Interventions    Discharge    Consult Status Consult Status: Follow-up Date: 08/02/20 Follow-up type: In-patient    [place] 08/02/2020, 10:26 AM

## 2020-08-02 NOTE — Lactation Note (Signed)
This note was copied from a baby's chart. Lactation Consultation Note  Patient Name: Shannon Lyons VOPFY'T Date: 08/02/2020 Reason for consult: Follow-up assessment;Primapara;1st time breastfeeding;Early term 37-38.6wks;Infant weight loss Age:27 hours  LC assisted dad with change of diaper / large wet Baby showing feeding cues and mom desired to learn a new BF position.  LC assisted to latch on the right breast / side lying with depth / swallows and per mom more comfortable, baby still feeding at 17 mins. [facility] RN aware to document finishing time.  [place] praised mom for her consistent latching.   Maternal Data Has patient been taught Hand Expression?: Yes  Feeding Mother's Current Feeding Choice: Breast Milk  LATCH Score Latch: Grasps breast easily, tongue down, lips flanged, rhythmical sucking.  Audible Swallowing: Spontaneous and intermittent  Type of Nipple: Everted at rest and after stimulation  Comfort (Breast/Nipple): Filling, red/small blisters or bruises, mild/mod discomfort  Hold (Positioning): Assistance needed to correctly position infant at breast and maintain latch.  LATCH Score: 8   Lactation Tools Discussed/Used    Interventions Interventions: Breast feeding basics reviewed;Assisted with latch;Skin to skin;Breast massage;Hand express;Breast compression;Adjust position;Support pillows;Shells;Hand pump  Discharge Pump: Manual  Consult Status Consult Status: Follow-up Date: 08/03/20 Follow-up type: In-patient    [place] 08/02/2020, 10:41 AM

## 2020-08-03 MED ORDER — IBUPROFEN 600 MG PO TABS: 600.0000 mg | ORAL_TABLET | Freq: Four times a day (QID) | ORAL | 0 refills | Status: DC

## 2020-08-03 MED ORDER — AMLODIPINE BESYLATE 5 MG PO TABS: 5.0000 mg | ORAL_TABLET | Freq: Every day | ORAL | 1 refills | Status: DC

## 2020-08-03 NOTE — Lactation Note (Signed)
This note was copied from a baby's chart. Lactation Consultation Note  Patient Name: Shannon Lyons KLTYV'D Date: 08/03/2020 Reason for consult: Follow-up assessment;Infant weight loss Age:27 hours Infant with 8% wt loss. Mom and baby bf'ing independently. Provided ed. Reviewed risk of delay of onset of copious milk because of pp hemorrhage. LC recommends early f/u with Peds and outpatient LC. Referral sent to [facility]. RN to place ambulatory referral.  Maternal Data  Mother with hx of pp hemorrhage. No breast changes at this time.  Feeding Mother's Current Feeding Choice: Breast Milk  Interventions Interventions: Education;Breast feeding basics reviewed  Discharge Discharge Education: Engorgement and breast care;Warning signs for feeding baby;Outpatient recommendation;Outpatient Epic message sent  Consult Status Consult Status: Complete Follow-up type: In-patient   Gwynne Edinger, MA IBCLC 08/03/2020, 11:35 AM

## 2020-08-03 NOTE — Plan of Care (Signed)
  Problem: Education: °Goal: Knowledge of General Education information will improve °Description: Including pain rating scale, medication(s)/side effects and non-pharmacologic comfort measures °Outcome: Completed/Met °  °Problem: Health Behavior/Discharge Planning: °Goal: Ability to manage health-related needs will improve °Outcome: Completed/Met °  °Problem: Clinical Measurements: °Goal: Ability to maintain clinical measurements within normal limits will improve °Outcome: Completed/Met °Goal: Will remain free from infection °Outcome: Completed/Met °Goal: Diagnostic test results will improve °Outcome: Completed/Met °Goal: Respiratory complications will improve °Outcome: Completed/Met °Goal: Cardiovascular complication will be avoided °Outcome: Completed/Met °  °Problem: Activity: °Goal: Risk for activity intolerance will decrease °Outcome: Completed/Met °  °Problem: Nutrition: °Goal: Adequate nutrition will be maintained °Outcome: Completed/Met °  °Problem: Coping: °Goal: Level of anxiety will decrease °Outcome: Completed/Met °  °Problem: Elimination: °Goal: Will not experience complications related to bowel motility °Outcome: Completed/Met °Goal: Will not experience complications related to urinary retention °Outcome: Completed/Met °  °Problem: Pain Managment: °Goal: General experience of comfort will improve °Outcome: Completed/Met °  °Problem: Safety: °Goal: Ability to remain free from injury will improve °Outcome: Completed/Met °  °Problem: Skin Integrity: °Goal: Risk for impaired skin integrity will decrease °Outcome: Completed/Met °  °Problem: Education: °Goal: Knowledge of disease or condition will improve °Outcome: Completed/Met °Goal: Knowledge of the prescribed therapeutic regimen will improve °Outcome: Completed/Met °  °Problem: Fluid Volume: °Goal: Peripheral tissue perfusion will improve °Outcome: Completed/Met °  °Problem: Clinical Measurements: °Goal: Complications related to disease process,  condition or treatment will be avoided or minimized °Outcome: Completed/Met °  °Problem: Education: °Goal: Knowledge of condition will improve °Outcome: Completed/Met °Goal: Individualized Educational Video(s) °Outcome: Completed/Met °Goal: Individualized Newborn Educational Video(s) °Outcome: Completed/Met °  °Problem: Activity: °Goal: Will verbalize the importance of balancing activity with adequate rest periods °Outcome: Completed/Met °Goal: Ability to tolerate increased activity will improve °Outcome: Completed/Met °  °Problem: Coping: °Goal: Ability to identify and utilize available resources and services will improve °Outcome: Completed/Met °  °Problem: Life Cycle: °Goal: Chance of risk for complications during the postpartum period will decrease °Outcome: Completed/Met °  °Problem: Role Relationship: °Goal: Ability to demonstrate positive interaction with newborn will improve °Outcome: Completed/Met °  °Problem: Skin Integrity: °Goal: Demonstration of wound healing without infection will improve °Outcome: Completed/Met °  °

## 2020-08-03 NOTE — Discharge Instructions (Signed)

## 2020-08-03 NOTE — Progress Notes (Signed)
RN reviewed and instructed patient on postpartum care and follow up as well as medications and newborn care. RN instructed patient to call for 1 week and 4 week appointment. Patient verbalized understanding.

## 2020-08-06 ENCOUNTER — Other Ambulatory Visit: Payer: Self-pay

## 2020-08-06 ENCOUNTER — Ambulatory Visit (INDEPENDENT_AMBULATORY_CARE_PROVIDER_SITE_OTHER): Payer: BC Managed Care – PPO | Admitting: *Deleted

## 2020-08-06 ENCOUNTER — Encounter: Payer: Self-pay | Admitting: *Deleted

## 2020-08-06 VITALS — BP 131/83 | HR 54 | Ht 63.0 in | Wt 161.7 lb

## 2020-08-06 DIAGNOSIS — Z0130 Encounter for examination of blood pressure without abnormal findings: Secondary | ICD-10-CM

## 2020-08-06 NOTE — Progress Notes (Signed)
Pt presents for BP check following vaginal delivery on 3/11. BP - 131/83, P - 54. Pt is taking Amlodipine 5 mg once daily as prescribed. She was told to continue taking Amlodipine until PP visit at which time the need for continuation will be determined. She reports occasional blurry vision and feels this may be related to her contacts. She denies H/A, dizziness or seeing spots. Pt had questions regarding 1st degree perineal laceration and whether the stitches would need to be removed. Pt was advised these will dissolve. She will have exam during PP visit to assess the healing of this area. Pt voiced understanding of all information and instructions given.

## 2020-08-08 NOTE — Progress Notes (Signed)
Patient was assessed and managed by nursing staff during this encounter. I have reviewed the chart and agree with the documentation and plan.  Gabriel Carina, CNM 08/08/2020 4:47 PM

## 2020-08-26 ENCOUNTER — Other Ambulatory Visit: Payer: Self-pay | Admitting: Obstetrics & Gynecology

## 2020-09-04 ENCOUNTER — Ambulatory Visit (INDEPENDENT_AMBULATORY_CARE_PROVIDER_SITE_OTHER): Payer: BC Managed Care – PPO | Admitting: Student

## 2020-09-04 NOTE — Progress Notes (Signed)
[place] Part[facility] Visit Note  Shannon Lyons is a 25 y.o. G2P1001 female who presents for a postpartum visit. She is 4 weeks postpartum following a normal spontaneous vaginal delivery.  I have fully reviewed the prenatal and intrapartum course. The delivery was at [redacted]w[redacted]d gestational weeks.  Anesthesia: epidural. Postpartum course has been uneventful. Baby is doing well. Baby is feeding by breast. Bleeding no bleeding. Bowel function is normal. Bladder function is normal. Patient is not sexually active. Contraception method is none. Postpartum depression screening: negative.   The pregnancy intention screening data noted above was reviewed. Potential methods of contraception were discussed. The patient elected to proceed with Hormonal Implant-not at this visit.    Edinburgh Postnatal Depression Scale - 09/04/20 1335      Edinburgh Postnatal Depression Scale:  In the Past 7 Days   I have been able to laugh and see the funny side of things. 0    I have looked forward with enjoyment to things. 0    I have blamed myself unnecessarily when things went wrong. 0    I have been anxious or worried for no good reason. 0    I have felt scared or panicky for no good reason. 0    Things have been getting on top of me. 0    I have been so unhappy that I have had difficulty sleeping. 0    I have felt sad or miserable. 0    I have been so unhappy that I have been crying. 0    The thought of harming myself has occurred to me. 0    Edinburgh Postnatal Depression Scale Total 0           Health Maintenance Due  Topic Date Due  . HPV VACCINES (1 - 2-dose series) Never done  . COVID-19 Vaccine (1) Never done    The following portions of the patient's history were reviewed and updated as appropriate: allergies, current medications, past family history, past medical history, past social history, past surgical history and problem list.  Review of Systems Pertinent items are noted in HPI.  Objective:   BP 114/60   Pulse (!) 59   Ht 5\' 3"  (1.6 m)   Wt 152 lb 14.4 oz (69.4 kg)   Breastfeeding Yes   BMI 27.09 kg/m    General:  alert, cooperative and no distress   Breasts:  not indicated  Lungs: clear to auscultation bilaterally  Heart:  regular rate and rhythm, S1, S2 normal, no murmur, click, rub or gallop  Abdomen: soft, non-tender; bowel sounds normal; no masses,  no organomegaly   Wound   GU exam:  normal; laceration is well-healed, still some tenderness to palpation       Assessment:   1. Postpartum care and examination    Healthy postpartum exam.   Plan:   Essential components of care per ACOG recommendations:  1.  Mood and well being: Patient with negative depression screening today. Reviewed local resources for support.  - Patient does not use tobacco. - hx of drug use? No  2. Infant care and feeding:  -Patient currently breastmilk feeding? Yes If breastmilk feeding discussed return to work and pumping. If needed, patient was provided letter for work to allow for every 2-3 hr pumping breaks, and to be granted a private location to express breastmilk and refrigerated area to store breastmilk. Reviewed importance of draining breast regularly to support lactation. -Social determinants of health (SDOH) reviewed in EPIC. No  concerns  3. Sexuality, contraception and birth spacing - Patient does not want a pregnancy in the next year.  Desired family size is 3 children.  - Reviewed forms of contraception in tiered fashion. Patient desired nexplanon-will schedule for next week today.   - Discussed birth spacing of 18 months  4. Sleep and fatigue -Encouraged family/partner/community support of 4 hrs of uninterrupted sleep to help with mood and fatigue  5. Physical Recovery  - Discussed patients delivery and complications - Patient had a Vaginal, no problems at delivery. Perineal healing reviewed. Patient expressed understanding - Patient has urinary incontinence? No  -  Patient is not safe to resume physical and sexual activity; encouraged patient to wait until she is more comfortable to resume vaginal intercourse; return for reassessment in one month in person and for Nexplanon  6.  Health Maintenance - HM due items addressed Yes-patient had elevated BP; recommended that she stop taking norvasc and have BP check next week with RN - Last pap smear done 8/2021and was normal with negative HPV. Pap smear not done at today's visit.  Mammogram due NA  7. Chronic Disease  - PCP follow up  [place], [place] for [place], [place]

## 2020-09-09 ENCOUNTER — Other Ambulatory Visit: Payer: Self-pay | Admitting: Obstetrics & Gynecology

## 2020-09-11 ENCOUNTER — Ambulatory Visit: Payer: BC Managed Care – PPO

## 2020-10-13 ENCOUNTER — Ambulatory Visit: Payer: BC Managed Care – PPO | Admitting: Nurse Practitioner

## 2020-10-22 ENCOUNTER — Emergency Department (HOSPITAL_COMMUNITY)
Admission: EM | Admit: 2020-10-22 | Discharge: 2020-10-22 | Disposition: A | Discharge status: Home / Self Care | Payer: BC Managed Care – PPO | Attending: Emergency Medicine | Admitting: Emergency Medicine

## 2020-10-22 ENCOUNTER — Other Ambulatory Visit: Payer: Self-pay

## 2020-10-22 DIAGNOSIS — Z859 Personal history of malignant neoplasm, unspecified: Secondary | ICD-10-CM | POA: Diagnosis not present

## 2020-10-22 DIAGNOSIS — S3992XA Unspecified injury of lower back, initial encounter: Secondary | ICD-10-CM | POA: Diagnosis present

## 2020-10-22 DIAGNOSIS — X58XXXA Exposure to other specified factors, initial encounter: Secondary | ICD-10-CM | POA: Diagnosis not present

## 2020-10-22 DIAGNOSIS — Z79899 Other long term (current) drug therapy: Secondary | ICD-10-CM | POA: Diagnosis not present

## 2020-10-22 DIAGNOSIS — I10 Essential (primary) hypertension: Secondary | ICD-10-CM | POA: Insufficient documentation

## 2020-10-22 DIAGNOSIS — S39012A Strain of muscle, fascia and tendon of lower back, initial encounter: Secondary | ICD-10-CM | POA: Diagnosis not present

## 2020-10-22 DIAGNOSIS — R109 Unspecified abdominal pain: Secondary | ICD-10-CM | POA: Insufficient documentation

## 2020-10-22 LAB — COMPREHENSIVE METABOLIC PANEL
ALT: 19 U/L (ref 0–44)
AST: 30 U/L (ref 15–41)
Albumin: 4.2 g/dL (ref 3.5–5.0)
Alkaline Phosphatase: 80 U/L (ref 38–126)
Anion gap: 6 (ref 5–15)
BUN: 12 mg/dL (ref 6–20)
CO2: 24 mmol/L (ref 22–32)
Calcium: 9.4 mg/dL (ref 8.9–10.3)
Chloride: 106 mmol/L (ref 98–111)
Creatinine, Ser: 0.68 mg/dL (ref 0.44–1.00)
GFR, Estimated: >60 mL/min (ref >60)
Glucose, Bld: 89 mg/dL (ref 70–99)
Potassium: 4.3 mmol/L (ref 3.5–5.1)
Sodium: 136 mmol/L (ref 135–145)
Total Bilirubin: 0.3 mg/dL (ref 0.3–1.2)
Total Protein: 7.2 g/dL (ref 6.5–8.1)

## 2020-10-22 LAB — CBC WITH DIFFERENTIAL/PLATELET
Abs Immature Granulocytes: 0.03 10*3/uL (ref 0.00–0.07)
Basophils Absolute: 0 10*3/uL (ref 0.0–0.1)
Basophils Relative: 0 %
Eosinophils Absolute: 0.1 10*3/uL (ref 0.0–0.5)
Eosinophils Relative: 2 %
HCT: 40.7 % (ref 36.0–46.0)
Hemoglobin: 12.8 g/dL (ref 12.0–15.0)
Immature Granulocytes: 1 %
Lymphocytes Relative: 32 %
Lymphs Abs: 2 10*3/uL (ref 0.7–4.0)
MCH: 25.2 pg — ABNORMAL LOW (ref 26.0–34.0)
MCHC: 31.4 g/dL (ref 30.0–36.0)
MCV: 80.3 fL (ref 80.0–100.0)
Monocytes Absolute: 0.5 10*3/uL (ref 0.1–1.0)
Monocytes Relative: 9 %
Neutro Abs: 3.6 10*3/uL (ref 1.7–7.7)
Neutrophils Relative %: 56 %
Platelets: 251 10*3/uL (ref 150–400)
RBC: 5.07 MIL/uL (ref 3.87–5.11)
RDW: 14.8 % (ref 11.5–15.5)
WBC: 6.4 10*3/uL (ref 4.0–10.5)
nRBC: 0 % (ref 0.0–0.2)

## 2020-10-22 LAB — URINALYSIS, ROUTINE W REFLEX MICROSCOPIC
Bilirubin Urine: NEGATIVE
Glucose, UA: NEGATIVE mg/dL
Hgb urine dipstick: NEGATIVE
Ketones, ur: NEGATIVE mg/dL
Leukocytes,Ua: NEGATIVE
Nitrite: NEGATIVE
Protein, ur: NEGATIVE mg/dL
Specific Gravity, Urine: 1.024 (ref 1.005–1.030)
pH: 5 (ref 5.0–8.0)

## 2020-10-22 LAB — I-STAT BETA HCG BLOOD, ED (MC, WL, AP ONLY): I-stat hCG, quantitative: <5 m[IU]/mL (ref <5)

## 2020-10-22 MED ORDER — KETOROLAC TROMETHAMINE 15 MG/ML IJ SOLN
15.0000 mg | Freq: Once | INTRAMUSCULAR | Status: AC
  Administered 2020-10-22: 15 mg via INTRAVENOUS
  Filled 2020-10-22: qty 1

## 2020-10-22 MED ORDER — CYCLOBENZAPRINE HCL 10 MG PO TABS: 10.0000 mg | ORAL_TABLET | Freq: Three times a day (TID) | ORAL | 0 refills | Status: DC | PRN

## 2020-10-22 MED ORDER — ONDANSETRON HCL 4 MG/2ML IJ SOLN
4.0000 mg | Freq: Once | INTRAMUSCULAR | Status: AC
  Administered 2020-10-22: 4 mg via INTRAVENOUS
  Filled 2020-10-22: qty 2

## 2020-10-22 NOTE — ED Triage Notes (Signed)
Pt presented POV with c/c of flank pain. Pt does endorse some urinary changes (difficility starting stream). Pt states that she feels pain moving down with some radiation into the leg.

## 2020-10-22 NOTE — ED Provider Notes (Signed)
[facility] EMERGENCY DEPARTMENT Provider Note   CSN: [id number] Arrival date & time: 10/22/20  0737     History Chief Complaint  Patient presents with  . Flank Pain    Pt presented POV with c/c of flank pain. Pt does endorse some urinary changes (difficility starting stream). Pt states that she feels pain moving down with some radiation into the leg.       Shannon Lyons is a 25 y.o. female.  Presented to ER with concern for flank pain/back pain.  Patient reports that pain is primarily on the left side, going on for the past 3 days or so.  Initially mild but now more moderate to severe.  Sharp and stabbing, radiating down left leg.  Denies pain with any no vaginal discharge.  Currently breast-feeding.  No nausea, vomiting, fevers.  Took some Motrin with some relief.  Denies history of any abdominal surgeries.  Specifically denies history of appendectomy or cholecystectomy.  No recent trauma, no bladder or bowel incontinence.  HPI     Past Medical History:  Diagnosis Date  . Cancer ([place])   . Hypertension   . PONV (postoperative nausea and vomiting)   . Pregnancy induced hypertension   . Pyelonephritis 02/2015  . UTI (lower urinary tract infection)     Patient Active Problem List   Diagnosis Date Noted  . Gestational hypertension 07/30/2020  . History of salpingo-oophorectomy 01/21/2020  . History of pyelonephritis 01/21/2020  . 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 TUMOR REMOVAL  2012   tumor removed from near right ovary; ovary and tube removed per pt     OB History    Gravida  1   Para  1   Term  1   Preterm  0   AB  0   Living  1     SAB  0   IAB  0   Ectopic  0   Multiple  0   Live Births  1           Family History  Problem Relation Age of Onset  . Diabetes Maternal Grandfather   . Cancer Paternal Grandfather   . Diabetes Maternal Uncle     Social  History   Tobacco Use  . Smoking status: Never Smoker  . Smokeless tobacco: Never Used  Vaping Use  . Vaping Use: Never used  Substance Use Topics  . Alcohol use: Not Currently  . Drug use: No    Home Medications Prior to Admission medications   Medication Sig Start Date End Date Taking? Authorizing Provider  cyclobenzaprine (FLEXERIL) 10 MG tablet Take 1 tablet (10 mg total) by mouth 3 (three) times daily as needed for muscle spasms. 10/22/20  Yes Lucrezia Starch, MD  ibuprofen (ADVIL) 600 MG tablet Take 1 tablet (600 mg total) by mouth every 6 (six) hours. Patient taking differently: Take 600 mg by mouth every 8 (eight) hours as needed for mild pain. 08/03/20  Yes Florian Buff, MD  Prenatal Vit-Fe Fumarate-FA (PRENATAL PO) Take 1 tablet by mouth daily.   Yes [provider]  amLODipine (NORVASC) 5 MG tablet TAKE 1 TABLET (5 MG TOTAL) BY MOUTH DAILY. Patient not taking: Reported on 10/22/2020 09/09/20   Florian Buff, MD    Allergies    Patient has no known allergies.  Review of Systems   Review of Systems  Constitutional: Negative for chills  and fever.  HENT: Negative for ear pain and sore throat.   Eyes: Negative for pain and visual disturbance.  Respiratory: Negative for cough and shortness of breath.   Cardiovascular: Negative for chest pain and palpitations.  Gastrointestinal: Negative for abdominal pain and vomiting.  Genitourinary: Positive for flank pain. Negative for dysuria and hematuria.  Musculoskeletal: Positive for back pain. Negative for arthralgias.  Skin: Negative for color change and rash.  Neurological: Negative for seizures and syncope.  All other systems reviewed and are negative.   Physical Exam Updated Vital Signs BP 123/87 (BP Location: Right Arm)   Pulse (!) 53   Temp 98.1 F (36.7 C) (Oral)   Resp 18   Ht 5\' 3"  (1.6 m)   Wt 65.4 kg   LMP 09/30/2020 (Exact Date)   SpO2 99%   BMI 25.54 kg/m   Physical Exam Vitals and nursing  note reviewed.  Constitutional:      General: She is not in acute distress.    Appearance: She is well-developed.  HENT:     Head: Normocephalic and atraumatic.  Eyes:     Conjunctiva/sclera: Conjunctivae normal.  Cardiovascular:     Rate and Rhythm: Normal rate and regular rhythm.     Heart sounds: No murmur heard.   Pulmonary:     Effort: Pulmonary effort is normal. No respiratory distress.     Breath sounds: Normal breath sounds.  Abdominal:     Palpations: Abdomen is soft.     Tenderness: There is no abdominal tenderness.  Musculoskeletal:     Cervical back: Neck supple.     Comments: Some tenderness to palpation over the left paraspinal muscles and left flank, positive straight leg raise  Skin:    General: Skin is warm and dry.  Neurological:     Mental Status: She is alert.     Comments: 5/5 strength in RLE, LLE Sensation to light touch intact in RLE, LLE     ED Results / Procedures / Treatments   Labs (all labs ordered are listed, but only abnormal results are displayed) Labs Reviewed  CBC WITH DIFFERENTIAL/PLATELET - Abnormal; Notable for the following components:      Result Value   MCH 25.2 (*)    All other components within normal limits  COMPREHENSIVE METABOLIC PANEL  URINALYSIS, ROUTINE W REFLEX MICROSCOPIC  I-STAT BETA HCG BLOOD, ED ([facility], [facility], [facility] ONLY)    EKG None  Radiology No results found.  Procedures Procedures   Medications Ordered in ED Medications  ketorolac (TORADOL) 15 MG/ML injection 15 mg (15 mg Intravenous Given 10/22/20 0840)  ondansetron (ZOFRAN) injection 4 mg (4 mg Intravenous Given 10/22/20 9702)    ED Course  I have reviewed the triage vital signs and the nursing notes.  Pertinent labs & imaging results that were available during my care of the patient were reviewed by me and considered in my medical decision making (see chart for details).    MDM Rules/Calculators/A&P                         25 year old lady presenting to  ER with concern for left lower back/left flank pain rating down left leg.  On exam well-appearing in no distress.  Basic labs are stable, UA negative for infection, no hematuria.  Suspect musculoskeletal in nature at present. No trauma. neurovascuarly intact. Symptoms well controlled after dose of toradol.  Discussed with ER pharmacist, okay for Flexeril and breast-feeding.  Will  provide Rx for Flexeril as needed.  Recommend recheck with primary doctor, reviewed return precautions and discharged home..    After the discussed management above, the patient was determined to be safe for discharge.  The patient was in agreement with this plan and all questions regarding their care were answered.  ED return precautions were discussed and the patient will return to the ED with any significant worsening of condition.    Final Clinical Impression(s) / ED Diagnoses Final diagnoses:  Strain of lumbar region, initial encounter    Rx / DC Orders ED Discharge Orders         Ordered    cyclobenzaprine (FLEXERIL) 10 MG tablet  3 times daily PRN        10/22/20 1018           Lucrezia Starch, MD 10/23/20 [phone]

## 2020-10-22 NOTE — Discharge Instructions (Signed)
Recommend take Tylenol and Motrin as needed for pain control.  Additionally recommend taking the prescribed Flexeril as needed.  Follow-up with your primary care doctor for recheck later this week.  If you develop worsening pain, numbness, weakness in your extremities, any abdominal pain nausea, vomiting or other new concerning symptom, come back to ER for reassessment.

## 2020-10-27 ENCOUNTER — Emergency Department (HOSPITAL_BASED_OUTPATIENT_CLINIC_OR_DEPARTMENT_OTHER): Payer: BC Managed Care – PPO

## 2020-10-27 ENCOUNTER — Emergency Department (HOSPITAL_COMMUNITY)
Admission: EM | Admit: 2020-10-27 | Discharge: 2020-10-27 | Disposition: A | Discharge status: Home / Self Care | Payer: BC Managed Care – PPO | Attending: Emergency Medicine | Admitting: Emergency Medicine

## 2020-10-27 DIAGNOSIS — M79652 Pain in left thigh: Secondary | ICD-10-CM | POA: Insufficient documentation

## 2020-10-27 DIAGNOSIS — M545 Low back pain, unspecified: Secondary | ICD-10-CM | POA: Insufficient documentation

## 2020-10-27 DIAGNOSIS — I10 Essential (primary) hypertension: Secondary | ICD-10-CM | POA: Insufficient documentation

## 2020-10-27 DIAGNOSIS — Z859 Personal history of malignant neoplasm, unspecified: Secondary | ICD-10-CM | POA: Diagnosis not present

## 2020-10-27 DIAGNOSIS — M79605 Pain in left leg: Secondary | ICD-10-CM | POA: Diagnosis not present

## 2020-10-27 DIAGNOSIS — M7918 Myalgia, other site: Secondary | ICD-10-CM

## 2020-10-27 MED ORDER — IBUPROFEN 600 MG PO TABS: 600.0000 mg | ORAL_TABLET | Freq: Four times a day (QID) | ORAL | 0 refills | Status: DC | PRN

## 2020-10-27 MED ORDER — ONDANSETRON HCL 4 MG/2ML IJ SOLN: 4.0000 mg | Freq: Once | INTRAMUSCULAR | Status: DC

## 2020-10-27 MED ORDER — ONDANSETRON 4 MG PO TBDP
4.0000 mg | ORAL_TABLET | Freq: Once | ORAL | Status: AC
  Administered 2020-10-27: 4 mg via ORAL
  Filled 2020-10-27: qty 1

## 2020-10-27 MED ORDER — ACETAMINOPHEN 500 MG PO TABS: 1000.0000 mg | ORAL_TABLET | Freq: Four times a day (QID) | ORAL | 0 refills | Status: AC | PRN

## 2020-10-27 MED ORDER — PREDNISONE 10 MG PO TABS: 10.0000 mg | ORAL_TABLET | Freq: Every day | ORAL | 0 refills | Status: AC

## 2020-10-27 MED ORDER — HYDROCODONE-ACETAMINOPHEN 5-325 MG PO TABS
1.0000 | ORAL_TABLET | Freq: Once | ORAL | Status: AC
  Administered 2020-10-27: 1 via ORAL
  Filled 2020-10-27: qty 1

## 2020-10-27 NOTE — ED Provider Notes (Signed)
[place] EMERGENCY DEPARTMENT Provider Note   CSN: [id number] Arrival date & time: 10/27/20  1251     History Chief Complaint  Patient presents with  . Leg Pain    Shannon Lyons is a 25 y.o. female presents to the ED history of left ovary teratoma s/p left salpingo-oophorectomy, 3 months postpartum presents to the ED for evaluation of left-sided low back pain that radiates to the posterior aspect of her left leg all the way down to her toes.  Pain is constant but significantly worse when she sits up or puts weight in her left leg.  She is having a hard time sitting up to breast-feed because this is the position that makes her pain the worst.  Also is having a hard time picking up her child.  Denies any falls.  Denies any groin numbness, difficulty controlling her bladder or bowels.  No fever.  No dysuria, hematuria.  No changes in bowel movements.  No previous history of back injuries or surgeries.  No leg swelling, tingling, numbness.  Patient was seen in the ED for this recently.  She was told it was a muscular injury.  She was discharged with Flexeril which is not helping.  She is also been taken Tylenol which only provides temporary relief.  HPI     Past Medical History:  Diagnosis Date  . Cancer ([place])   . Hypertension   . PONV (postoperative nausea and vomiting)   . Pregnancy induced hypertension   . Pyelonephritis 02/2015  . UTI (lower urinary tract infection)     Patient Active Problem List   Diagnosis Date Noted  . Gestational hypertension 07/30/2020  . History of salpingo-oophorectomy 01/21/2020  . History of pyelonephritis 01/21/2020  . 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 TUMOR REMOVAL  2012   tumor removed from near right ovary; ovary and tube removed per pt     OB History    Gravida  1   Para  1   Term  1   Preterm  0   AB  0   Living  1     SAB  0   IAB  0    Ectopic  0   Multiple  0   Live Births  1           Family History  Problem Relation Age of Onset  . Diabetes Maternal Grandfather   . Cancer Paternal Grandfather   . Diabetes Maternal Uncle     Social History   Tobacco Use  . Smoking status: Never Smoker  . Smokeless tobacco: Never Used  Vaping Use  . Vaping Use: Never used  Substance Use Topics  . Alcohol use: Not Currently  . Drug use: No    Home Medications Prior to Admission medications   Medication Sig Start Date End Date Taking? Authorizing Provider  acetaminophen (TYLENOL) 500 MG tablet Take 2 tablets (1,000 mg total) by mouth every 6 (six) hours as needed for up to 15 days. 10/27/20 11/11/20 Yes Kinnie Feil, PA-C  ibuprofen (ADVIL) 600 MG tablet Take 1 tablet (600 mg total) by mouth every 6 (six) hours as needed. 10/27/20  Yes Kinnie Feil, PA-C  predniSONE (DELTASONE) 10 MG tablet Take 1 tablet (10 mg total) by mouth daily with breakfast for 7 days. 10/27/20 11/03/20 Yes Carmon Sails J, PA-C  amLODipine (NORVASC) 5 MG tablet TAKE 1 TABLET (5 MG  TOTAL) BY MOUTH DAILY. Patient not taking: Reported on 10/22/2020 09/09/20   Florian Buff, MD  cyclobenzaprine (FLEXERIL) 10 MG tablet Take 1 tablet (10 mg total) by mouth 3 (three) times daily as needed for muscle spasms. 10/22/20   Lucrezia Starch, MD  Prenatal Vit-Fe Fumarate-FA (PRENATAL PO) Take 1 tablet by mouth daily.    [provider]    Allergies    Patient has no known allergies.  Review of Systems   Review of Systems  Musculoskeletal: Positive for back pain, gait problem and myalgias.  All other systems reviewed and are negative.   Physical Exam Updated Vital Signs BP 133/90   Pulse 85   Temp 98.7 F (37.1 C) (Oral)   Resp 18   LMP 09/30/2020 (Exact Date)   SpO2 100%   Physical Exam Constitutional:      Appearance: She is well-developed.  HENT:     Head: Normocephalic.     Nose: Nose normal.  Eyes:     General: Lids  are normal.  Cardiovascular:     Rate and Rhythm: Normal rate.     Comments: 1+ DP pulses bilaterally.  No lower extremity edema.  No calf tenderness. Pulmonary:     Effort: Pulmonary effort is normal. No respiratory distress.  Abdominal:     Palpations: Abdomen is soft.     Tenderness: There is no abdominal tenderness.     Comments: No CVA or suprapubic tenderness.  Musculoskeletal:        General: Tenderness present. Normal range of motion.     Cervical back: Normal range of motion.     Comments: TL spine: No midline tenderness or step-offs.  Tenderness to the left of the sacrum, left buttock.  Positive SLR.  Low back pain improved with knee flexion.  Negative Faber.  No pain with leg roll, IR, ER.  Skin:    Comments: Skin is normal over the back, abdomen and lower extremities.  No rash.  Neurological:     Mental Status: She is alert.     Comments: Sensation to light touch and strength intact in lower extremities.  Psychiatric:        Behavior: Behavior normal.     ED Results / Procedures / Treatments   Labs (all labs ordered are listed, but only abnormal results are displayed) Labs Reviewed - No data to display  EKG None  Radiology VAS [place] LOWER EXTREMITY VENOUS (DVT) ([facility] and [facility] 7a-7p)  Result Date: 10/27/2020  Lower Venous DVT Study Patient Name:  Shannon Lyons  Date of Exam:   10/27/2020 Medical Rec #: [id number]          Accession #:    [id number] Date of Birth: [birth year]         Patient Gender: F Patient Age:   84Y Exam Location:  [facility] Procedure:      VAS [place] LOWER EXTREMITY VENOUS (DVT) Referring Phys: [id number] ABIGAIL HARRIS --------------------------------------------------------------------------------  Indications: Pain.  Comparison Study: No prior studies. Performing Technologist: Shannon Lyons RDMS,RVT  Examination Guidelines: A complete evaluation includes B-mode imaging, spectral Doppler, color Doppler, and power Doppler as needed of all accessible  portions of each vessel. Bilateral testing is considered an integral part of a complete examination. Limited examinations for reoccurring indications may be performed as noted. The reflux portion of the exam is performed with the patient in reverse Trendelenburg.  +-----+---------------+---------+-----------+----------+--------------+ RIGHTCompressibilityPhasicitySpontaneityPropertiesThrombus Aging +-----+---------------+---------+-----------+----------+--------------+ CFV  Full  Yes      Yes                                 +-----+---------------+---------+-----------+----------+--------------+   +---------+---------------+---------+-----------+----------+--------------+ LEFT     CompressibilityPhasicitySpontaneityPropertiesThrombus Aging +---------+---------------+---------+-----------+----------+--------------+ CFV      Full           Yes      Yes                                 +---------+---------------+---------+-----------+----------+--------------+ SFJ      Full                                                        +---------+---------------+---------+-----------+----------+--------------+ FV Prox  Full                                                        +---------+---------------+---------+-----------+----------+--------------+ FV Mid   Full                                                        +---------+---------------+---------+-----------+----------+--------------+ FV DistalFull                                                        +---------+---------------+---------+-----------+----------+--------------+ PFV      Full                                                        +---------+---------------+---------+-----------+----------+--------------+ POP      Full           Yes      Yes                                 +---------+---------------+---------+-----------+----------+--------------+ PTV      Full                                                         +---------+---------------+---------+-----------+----------+--------------+ PERO     Full                                                        +---------+---------------+---------+-----------+----------+--------------+  Summary: RIGHT: - No evidence of common femoral vein obstruction.  LEFT: - There is no evidence of deep vein thrombosis in the lower extremity.  - No cystic structure found in the popliteal fossa.  *See table(s) above for measurements and observations.    Preliminary     Procedures Procedures   Medications Ordered in ED Medications  HYDROcodone-acetaminophen (NORCO/VICODIN) 5-325 MG per tablet 1 tablet (1 tablet Oral Given 10/27/20 1405)  ondansetron (ZOFRAN-ODT) disintegrating tablet 4 mg (4 mg Oral Given 10/27/20 1405)    ED Course  I have reviewed the triage vital signs and the nursing notes.  Pertinent labs & imaging results that were available during my care of the patient were reviewed by me and considered in my medical decision making (see chart for details).    MDM Rules/Calculators/A&P                           25 year old female approximately 3 months postpartum presents to the ED for continued left buttock pain that radiates to the posterior aspect of the left leg.  Positional.  She has reproducible tenderness left of the sacrum, round sciatic notch with positive SLR.  No red flags like trauma, fever, saddle anesthesia, lack of bladder or bowel control, IV drug use, weakness or pulse deficits on exam.  No abdominal or urinary symptoms either.  EMR, triage nurse notes reviewed.  Chart review reveals patient was seen in the ED 5 days ago for the same.  She was discharged with Flexeril which is not helping.Bilateral leg ultrasound to rule out a DVT was ordered by [facility] provider,   this is negative.  Given quite classic symptoms of MSK/radicular pain I discussed with patient that an x-ray would not be wrong to do  today but likely low yield.  She has had no preceding trauma.  She understands and is comfortable deferring x-ray in the ED, reasonable.  I considered bone fracture, dislocation, metastatic lesions unlikely.  Suspect radicular etiology or piriformis inflammation in setting of postpartum physiological changes. Discussed with pharmacist.  Low risk to start low-dose prednisone given that other interventions have not provided sustained release.  Will discharge with prednisone, ibuprofen, Tylenol, lidocaine patch, heat and orthopedic follow-up.  Considered other etiologies like fracture, pyelonephritis, ureteral stone, epidural abscess, AAA, dissection, cauda equina highly unlikely. Return precautions given.   Final Clinical Impression(s) / ED Diagnoses Final diagnoses:  Left buttock pain    Rx / DC Orders ED Discharge Orders         Ordered    predniSONE (DELTASONE) 10 MG tablet  Daily with breakfast        10/27/20 1919    ibuprofen (ADVIL) 600 MG tablet  Every 6 hours PRN        10/27/20 1919    acetaminophen (TYLENOL) 500 MG tablet  Every 6 hours PRN        10/27/20 1919           Arlean Hopping 10/27/20 1951    Valarie Merino, MD 10/30/20 [phone]

## 2020-10-27 NOTE — ED Provider Notes (Signed)
Emergency Medicine Provider Triage Evaluation Note  Shannon Lyons , a 25 y.o. female  was evaluated in triage.  Pt complains of left leg pain, recently diagnosed with sciatica, taking medications without improvement.  Worsening symptoms including feeling weak with able to ambulate, numbness in the foot.  Notably tachycardic denies chest pain or shortness of breath.  Review of Systems  Positive: L leg pain Negative: sob  Physical Exam  BP (!) 132/95 (BP Location: Right Arm)   Pulse (!) 126   Temp 98.6 F (37 C) (Oral)   Resp 16   LMP 09/30/2020 (Exact Date)   SpO2 98%  Gen:   Awake, no distress   Resp:  Normal effort  MSK:   Moves extremities without difficulty  Other:  Pos straight leg test L  Medical Decision Making  Medically screening exam initiated at 1:57 PM.  Appropriate orders placed.  Shannon Lyons was informed that the remainder of the evaluation will be completed by another provider, this initial triage assessment does not replace that evaluation, and the importance of remaining in the ED until their evaluation is complete.  Leg pain. dvt study . Pain meds   Margarita Mail, PA-C 10/27/20 1403    Charlesetta Shanks, MD 11/04/20 2137

## 2020-10-27 NOTE — ED Notes (Signed)
RN reviewed discharge instructions w/ pt. Follow up, prescriptions, pain management reviewed. Pt had no further questions.

## 2020-10-27 NOTE — Discharge Instructions (Signed)
Given your symptoms and physical exam findings, I suspect you have muscular spasm due to overuse/recent activity.  This could also be leading to mild nerve inflammation. It is possible you have sciatic nerve inflammation or piriformis muscle inflammation.   We will treat your symptoms with the following medication regimen: Prednisone 10 mg daily x 7 days Flexeril 10 mg at night time only.  This medicine is to relax the muscles.  Note that this medicine can cause drowsiness. You can reserve them to use at night only.  Do not mix these medicines with other sedating medicines, alcohol.  Due to sedating effects, these medicines predispose you to falls and unsteadiness with walking.  Ibuprofen 600 mg + Acetaminophen 1000 mg every 6 hours for the next 5 days, then use as needed Heating pad as needed Over the counter lidocaine patches (salonpas) can be helpful  Avoid any exacerbating activities for the next 48 hours.  After 48 hours, start doing light back range of motion exercises and walking to avoid worsening back stiffness.   Return for fevers, chills, abdominal pain, changes in bowel movement, urinary symptoms, groin numbness, loss of bladder or bowel control, numbness weakness or heaviness to your extremities, rash.

## 2020-10-27 NOTE — Progress Notes (Signed)
Lower extremity venous LT study completed.  Preliminary results relayed to providers in ED waiting area.   See CV Proc for preliminary results report.   Darlin Coco, RDMS, RVT

## 2020-10-27 NOTE — ED Triage Notes (Signed)
Pt arrived c/o left left leg pain. States pain is a sharp burning down hip to ankle   Was seen last wk for same complaint, no improvement  Denies trauma

## 2021-12-30 ENCOUNTER — Other Ambulatory Visit: Payer: Self-pay

## 2021-12-30 ENCOUNTER — Ambulatory Visit (INDEPENDENT_AMBULATORY_CARE_PROVIDER_SITE_OTHER): Payer: Self-pay

## 2021-12-30 DIAGNOSIS — Z3492 Encounter for supervision of normal pregnancy, unspecified, second trimester: Secondary | ICD-10-CM

## 2021-12-30 DIAGNOSIS — Z3201 Encounter for pregnancy test, result positive: Secondary | ICD-10-CM

## 2021-12-30 DIAGNOSIS — Z3200 Encounter for pregnancy test, result unknown: Secondary | ICD-10-CM

## 2021-12-30 LAB — POCT PREGNANCY, URINE: Preg Test, Ur: POSITIVE — AB

## 2021-12-30 NOTE — Progress Notes (Signed)
Possible Pregnancy  Here today to leave urine specimen for pregnancy confirmation. UPT in office today is positive. Pt reports first positive home UPT during the first week of July. Reviewed dating with patient:   LMP: 09/14/21 EDD: 06/21/22 15w 2d today  Pt reports she does not have regular menstrual period, will skip periods at times. Reviewed need for dating [place]. Will contact patient with appt by MyChart. OB history reviewed. Reviewed medications and allergies with patient. Recommended pt begin prenatal vitamin and schedule prenatal care. Pt would like to be seen in our office; front office will call to schedule appts following dating [place].  Annabell Howells, RN 12/30/2021  5:33 PM

## 2021-12-31 NOTE — Addendum Note (Signed)
Addended by: Annabell Howells on: 12/31/2021 10:13 AM   Modules accepted: Orders

## 2022-01-06 ENCOUNTER — Telehealth (INDEPENDENT_AMBULATORY_CARE_PROVIDER_SITE_OTHER): Payer: BC Managed Care – PPO

## 2022-01-06 DIAGNOSIS — Z3689 Encounter for other specified antenatal screening: Secondary | ICD-10-CM

## 2022-01-06 DIAGNOSIS — Z3480 Encounter for supervision of other normal pregnancy, unspecified trimester: Secondary | ICD-10-CM | POA: Insufficient documentation

## 2022-01-06 NOTE — Progress Notes (Signed)
New OB Intake  I connected with  Shannon Lyons on 01/06/22 at 11:15 AM EDT by MyChart Video Visit and verified that I am speaking with the correct person using two identifiers. Nurse is located at [facility] and pt is located at [facility].  I discussed the limitations, risks, security and privacy concerns of performing an evaluation and management service by telephone and the availability of in person appointments. I also discussed with the patient that there may be a patient responsible charge related to this service. The patient expressed understanding and agreed to proceed.  I explained I am completing New OB Intake today. We discussed her EDD of 06/21/22 that is based on LMP of 09/14/21. Pt is G2/P1. I reviewed her allergies, medications, Medical/Surgical/OB history, and appropriate screenings. I informed her of [facility] services. [facility] information placed in AVS. Based on history, this is a/an  pregnancy uncomplicated .   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 Patient given information for [facility] Healthy Baby website for more information about [facility] and [place]. Patient is not interested in water birth. Offered upcoming OB visit with CNM to discuss further.  MyChart/Babyscripts MyChart access verified. I explained pt will have some visits in office and some virtually. Babyscripts instructions given and order placed. Patient verifies receipt of registration text/e-mail. Account successfully created and app downloaded.  Blood Pressure Cuff/Weight Scale Pt has her own BP Cuff, Explained after first prenatal appt pt will check weekly and document in 47. Patient does / does not  have weight scale. Weight scale ordered for patient  to pick up from [employer].   Anatomy US Explained first scheduled Korea will be around 19 weeks. Anatomy US scheduled for 02/03/22 at 0945. Pt notified to arrive at 0930.  Labs Discussed [name] genetic screening with patient. Would like both Panorama and Horizon drawn at new OB visit. Routine prenatal labs needed.  Covid Vaccine Patient has not covid vaccine.   Is patient a CenteringPregnancy candidate?  Accepted Declined due to NA Not a candidate due to NA Centering Patient" indicated on sticky note   Is patient a Mom+Baby Combined Care candidate?  Not a candidate    Scheduled with Mom+Baby provider   Social Determinants of Health Food Insecurity: Patient denies food insecurity. WIC Referral: Patient is interested in referral to [facility].  Transportation: Patient denies transportation needs. Childcare: Discussed no children allowed at ultrasound appointments. Offered childcare services; patient declines childcare services at this time.  First visit review I reviewed new OB appt with pt. I explained she will have a provider visit that includes . Explained pt will be seen by Dr. Caron Presume at first visit; encounter routed to appropriate provider. Explained that patient will be seen by pregnancy navigator following visit with provider.   Bethanne Ginger, CMA 01/06/2022  11:18 AM

## 2022-01-06 NOTE — Patient Instructions (Signed)

## 2022-01-12 ENCOUNTER — Other Ambulatory Visit: Payer: Self-pay | Admitting: Obstetrics and Gynecology

## 2022-01-12 ENCOUNTER — Ambulatory Visit (INDEPENDENT_AMBULATORY_CARE_PROVIDER_SITE_OTHER): Payer: Self-pay

## 2022-01-12 DIAGNOSIS — Z3492 Encounter for supervision of normal pregnancy, unspecified, second trimester: Secondary | ICD-10-CM

## 2022-01-14 ENCOUNTER — Encounter: Payer: Self-pay | Admitting: *Deleted

## 2022-01-14 ENCOUNTER — Telehealth: Payer: Self-pay | Admitting: *Deleted

## 2022-01-14 NOTE — Telephone Encounter (Signed)
I called Shannon Lyons to review her appointments with her but reached her voicmail. I left a message with her new ob appointment and centeringpregnancy prenatal ob visit appointment and that I am sending a MyChart message. Staci Acosta

## 2022-01-18 ENCOUNTER — Other Ambulatory Visit (HOSPITAL_COMMUNITY)
Admission: RE | Admit: 2022-01-18 | Discharge: 2022-01-18 | Disposition: A | Discharge status: Home / Self Care | Payer: BC Managed Care – PPO | Source: Ambulatory Visit | Attending: Family Medicine | Admitting: Family Medicine

## 2022-01-18 ENCOUNTER — Ambulatory Visit (INDEPENDENT_AMBULATORY_CARE_PROVIDER_SITE_OTHER): Payer: Self-pay | Admitting: Family Medicine

## 2022-01-18 ENCOUNTER — Other Ambulatory Visit: Payer: Self-pay

## 2022-01-18 VITALS — BP 120/73 | HR 67 | Wt 149.0 lb

## 2022-01-18 DIAGNOSIS — Z3480 Encounter for supervision of other normal pregnancy, unspecified trimester: Secondary | ICD-10-CM

## 2022-01-18 MED ORDER — ASPIRIN 81 MG PO TBEC: 81.0000 mg | DELAYED_RELEASE_TABLET | Freq: Every day | ORAL | 12 refills | Status: DC

## 2022-01-18 NOTE — Progress Notes (Unsigned)
Patient reports lower left abdominal pain. She describes the pain as "sharp and stabbing" denies any vaginal bleeding or abnormal discharge. Kathlee Nations reports good FM.  Anatomy US scheduled for 09/13 '@945'$ 

## 2022-01-18 NOTE — Progress Notes (Unsigned)
Subjective:   Shannon Lyons is a 26 y.o. G2P1001 at 30w0dby LMP being seen today for her first obstetrical visit.  Her obstetrical history is significant for pregnancy induced hypertension. Patient does intend to breast feed. Pregnancy history fully reviewed.  Patient reports no bleeding, no contractions, no cramping, and no leaking.  HISTORY: OB History  Gravida Para Term Preterm AB Living  '2 1 1 '$ 0 0 1  SAB IAB Ectopic Multiple Live Births  0 0 0 0 1    # Outcome Date GA Lbr Len/2nd Weight Sex Delivery Anes PTL Lv  2 Current           1 Term 08/01/20 33w4d0:35 / 02:02 7 lb 9 oz (3.43 kg) F Vag-Spont EPI  LIV     Name: PANNEKA, BLANDA   Apgar1: 7  Apgar5: 9   Last pap smear was  01/21/2020 and was normal Past Medical History:  Diagnosis Date   Hypertension    PONV (postoperative nausea and vomiting)    Pregnancy induced hypertension    Pyelonephritis 02/2015   UTI (lower urinary tract infection)    Past Surgical History:  Procedure Laterality Date   TUMOR REMOVAL  2012   tumor removed from near right ovary; ovary and tube removed per pt   Family History  Problem Relation Age of Onset   Diabetes Maternal Grandfather    Cancer Paternal Grandfather    Diabetes Maternal Uncle    Social History   Tobacco Use   Smoking status: Never   Smokeless tobacco: Never  Vaping Use   Vaping Use: Never used  Substance Use Topics   Alcohol use: Not Currently   Drug use: No   No Known Allergies Current Outpatient Medications on File Prior to Visit  Medication Sig Dispense Refill   Prenatal Vit-Fe Fumarate-FA (PRENATAL PO) Take 1 tablet by mouth daily.     amLODipine (NORVASC) 5 MG tablet TAKE 1 TABLET (5 MG TOTAL) BY MOUTH DAILY. (Patient not taking: Reported on 10/22/2020) 90 tablet 1   cyclobenzaprine (FLEXERIL) 10 MG tablet Take 1 tablet (10 mg total) by mouth 3 (three) times daily as needed for muscle spasms. (Patient not taking: Reported on 12/30/2021) 20  tablet 0   ibuprofen (ADVIL) 600 MG tablet Take 1 tablet (600 mg total) by mouth every 6 (six) hours as needed. (Patient not taking: Reported on 12/30/2021) 30 tablet 0   No current facility-administered medications on file prior to visit.     Exam   Vitals:   01/18/22 1532  BP: 120/73  Pulse: 67  Weight: 149 lb (67.6 kg)   Fetal Heart Rate (bpm): 147  Uterus:     Pelvic Exam: Perineum: no hemorrhoids, normal perineum   Vulva: normal external genitalia, no lesions   Vagina:  normal mucosa, normal discharge   Cervix: no lesions and normal, pap smear done.    Adnexa: normal adnexa and no mass, fullness, tenderness   Bony Pelvis: average  System: General: well-developed, well-nourished female in no acute distress   Breast:  normal appearance, no masses or tenderness   Skin: normal coloration and turgor, no rashes   Neurologic: oriented, normal, negative, normal mood   Extremities: normal strength, tone, and muscle mass, ROM of all joints is normal   HEENT PERRLA, extraocular movement intact and sclera clear, anicteric   Mouth/Teeth mucous membranes moist, pharynx normal without lesions and dental hygiene good   Neck supple and no masses  Cardiovascular: regular rate and rhythm   Respiratory:  no respiratory distress, normal breath sounds   Abdomen: soft, non-tender; bowel sounds normal; no masses,  no organomegaly     Assessment:   Pregnancy: G2P1001 Patient Active Problem List   Diagnosis Date Noted   Supervision of other normal pregnancy, antepartum 01/06/2022   Gestational hypertension 07/30/2020   History of salpingo-oophorectomy 01/21/2020   History of pyelonephritis 01/21/2020   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 Supervision of other normal pregnancy, antepartum  - CBC/D/Plt+RPR+Rh+ABO+RubIgG... - Hemoglobin A1c - Panorama Prenatal Test Full Panel - AFP, Serum, Open Spina Bifida - Culture, OB Urine - Protein / creatinine ratio,  urine - Comprehensive metabolic panel - Cervicovaginal ancillary only( [place])  Patient has history of gestational hypertension.  Blood pressure within normal limits at today's visit.  Recommended daily aspirin 81 mg.  Sent prescription for this to patient's pharmacy.  We will continue to monitor blood pressures and treat as needed.  Initial labs drawn. Continue prenatal vitamins. Genetic Screening discussed, First trimester screen, Quad screen, and NIPS: requested. Ultrasound discussed; fetal anatomic survey: ordered. Problem list reviewed and updated. The nature of Lyons with multiple MDs and other Advanced Practice Providers was explained to patient; also emphasized that residents, students are part of our team. Routine obstetric precautions reviewed. No follow-ups on file.

## 2022-01-19 LAB — PROTEIN / CREATININE RATIO, URINE
Creatinine, Urine: 45.5 mg/dL
Protein, Ur: 25.1 mg/dL
Protein/Creat Ratio: 552 mg/g creat — ABNORMAL HIGH (ref 0–200)

## 2022-01-19 LAB — CERVICOVAGINAL ANCILLARY ONLY
Chlamydia: NEGATIVE
Comment: NEGATIVE
Comment: NORMAL
Neisseria Gonorrhea: NEGATIVE

## 2022-01-20 ENCOUNTER — Encounter: Payer: Self-pay | Admitting: Family Medicine

## 2022-01-20 LAB — CBC/D/PLT+RPR+RH+ABO+RUBIGG...
Antibody Screen: NEGATIVE
Basophils Absolute: 0 10*3/uL (ref 0.0–0.2)
Basos: 0 %
EOS (ABSOLUTE): 0.2 10*3/uL (ref 0.0–0.4)
Eos: 2 %
HCV Ab: NONREACTIVE
HIV Screen 4th Generation wRfx: NONREACTIVE
Hematocrit: 39.3 % (ref 34.0–46.6)
Hemoglobin: 13.2 g/dL (ref 11.1–15.9)
Hepatitis B Surface Ag: NEGATIVE
Immature Grans (Abs): 0.1 10*3/uL (ref 0.0–0.1)
Immature Granulocytes: 1 %
Lymphocytes Absolute: 2 10*3/uL (ref 0.7–3.1)
Lymphs: 23 %
MCH: 27.8 pg (ref 26.6–33.0)
MCHC: 33.6 g/dL (ref 31.5–35.7)
MCV: 83 fL (ref 79–97)
Monocytes Absolute: 0.6 10*3/uL (ref 0.1–0.9)
Monocytes: 6 %
Neutrophils Absolute: 6 10*3/uL (ref 1.4–7.0)
Neutrophils: 68 %
Platelets: 278 10*3/uL (ref 150–450)
RBC: 4.75 x10E6/uL (ref 3.77–5.28)
RDW: 14.6 % (ref 11.7–15.4)
RPR Ser Ql: NONREACTIVE
Rh Factor: POSITIVE
Rubella Antibodies, IGG: 4.47 index (ref >0.99)
WBC: 8.8 10*3/uL (ref 3.4–10.8)

## 2022-01-20 LAB — COMPREHENSIVE METABOLIC PANEL
ALT: 12 IU/L (ref 0–32)
AST: 15 IU/L (ref 0–40)
Albumin/Globulin Ratio: 1.5 (ref 1.2–2.2)
Albumin: 4.3 g/dL (ref 4.0–5.0)
Alkaline Phosphatase: 69 IU/L (ref 44–121)
BUN/Creatinine Ratio: 10 (ref 9–23)
BUN: 6 mg/dL (ref 6–20)
Bilirubin Total: <0.2 mg/dL (ref 0.0–1.2)
CO2: 18 mmol/L — ABNORMAL LOW (ref 20–29)
Calcium: 9.4 mg/dL (ref 8.7–10.2)
Chloride: 104 mmol/L (ref 96–106)
Creatinine, Ser: 0.58 mg/dL (ref 0.57–1.00)
Globulin, Total: 2.9 g/dL (ref 1.5–4.5)
Glucose: 79 mg/dL (ref 70–99)
Potassium: 3.8 mmol/L (ref 3.5–5.2)
Sodium: 136 mmol/L (ref 134–144)
Total Protein: 7.2 g/dL (ref 6.0–8.5)
eGFR: 127 mL/min/{1.73_m2} (ref >59)

## 2022-01-20 LAB — AFP, SERUM, OPEN SPINA BIFIDA
AFP MoM: 1.28
AFP Value: 57.8 ng/mL
Gest. Age on Collection Date: 18 weeks
Maternal Age At EDD: 28.2 yr
OSBR Risk 1 IN: 5096
Test Results:: NEGATIVE
Weight: 149 [lb_av]

## 2022-01-20 LAB — HEMOGLOBIN A1C
Est. average glucose Bld gHb Est-mCnc: 114 mg/dL
Hgb A1c MFr Bld: 5.6 % (ref 4.8–5.6)

## 2022-01-20 LAB — HCV INTERPRETATION

## 2022-01-20 NOTE — Progress Notes (Deleted)
     PRENATAL VISIT NOTE  Subjective:  Shannon Lyons is a 26 y.o. G2P1001 at 49w2dbeing seen today for ongoing prenatal care.  She is currently monitored for the following issues for this {Blank single:19197::"high-risk","low-risk"} pregnancy and has Immature teratoma of ovary ([place]; History of salpingo-oophorectomy; History of pyelonephritis; History of gestational hypertension; and Supervision of other normal pregnancy, antepartum on their problem list.  Patient reports {sx:14538}.   .  .   . Denies leaking of fluid.   The following portions of the patient's history were reviewed and updated as appropriate: allergies, current medications, past family history, past medical history, past social history, past surgical history and problem list.   Objective:  There were no vitals filed for this visit.  Fetal Status:           General:  Alert, oriented and cooperative. Patient is in no acute distress.  Skin: Skin is warm and dry. No rash noted.   Cardiovascular: Normal heart rate noted  Respiratory: Normal respiratory effort, no problems with respiration noted  Abdomen: Soft, gravid, appropriate for gestational age.        Pelvic: {Blank single:19197::"Cervical exam performed in the presence of a chaperone","Cervical exam deferred"}        Extremities: Normal range of motion.     Mental Status: Normal mood and affect. Normal behavior. Normal judgment and thought content.   Assessment and Plan:  Pregnancy: G2P1001 at 166w2d. Supervision of other normal pregnancy, antepartum  Centering Pregnancy, Session#2: Reviewed rules for self-governance with group. Direct group to [employer]  Facilitated discussion today:  Nutrition, Back pain, Genetic screening options with AFP/Quad.   Mindfulness activity completed as well as deep breathing with 1 minutes of tension and 1 minute of relaxation for childbirth preparation.  Also participated in mindful eating  activity Activity-demonstrated exercises to alleviate back pain.  Fundal height and FHR appropriate today unless noted otherwise in plan. Patient to continue group care.    2. History of gestational hypertension ***  {Blank single:19197::"Term","Preterm"} labor symptoms and general obstetric precautions including but not limited to vaginal bleeding, contractions, leaking of fluid and fetal movement were reviewed in detail with the patient. Please refer to After Visit Summary for other counseling recommendations.   No follow-ups on file.  Future Appointments  Date Time Provider DeWaucoma8/30/2023  9:00 AM NeCaren MacadamMD WMTexas Health [facility] PlanoMPrisc[facility]9/13/2023  9:30 AM WMC-MFC NURSE WMC-MFC [facility]9/13/2023  9:45 AM WMC-MFC US5 WMC-MFCUS [facility]9/27/2023  9:00 AM CENTERING PROVIDER WMC-CWH [facility]MDix[facility]10/25/2023  9:00 AM CENTERING PROVIDER [facility]MC-CWH WM[facility]11/12/2021  9:00 AM CENTERING PROVIDER WMC-CWH WMHill[facility]11/22/2023  9:00 AM CENTERING PROVIDER WMC-CWH WMCl[facility]12/10/2021  9:00 AM CENTERING PROVIDER WMDoctors Same Day Surgery Center LtdMLgh [facility]12/20/2023  9:00 AM CENTERING PROVIDER WMWellington Regional [facility] CenterMYalobush[facility]1/07/2022  9:00 AM CENTERING PROVIDER WMHouston [facility]rologic [facility] LLCME[facility]1/17/2024  9:00 AM CENTERING PROVIDER WMC-CWH WMWana[place]  KiCaren MacadamMD

## 2022-01-21 LAB — URINE CULTURE, OB REFLEX

## 2022-01-21 LAB — CULTURE, OB URINE

## 2022-01-22 ENCOUNTER — Telehealth: Payer: Self-pay | Admitting: Family Medicine

## 2022-01-22 ENCOUNTER — Encounter: Payer: Self-pay | Admitting: *Deleted

## 2022-01-22 MED ORDER — CEFADROXIL 500 MG PO CAPS
500.0000 mg | ORAL_CAPSULE | Freq: Two times a day (BID) | ORAL | 0 refills | Status: DC
Start: 2022-01-22 — End: 2022-03-17

## 2022-01-22 NOTE — Telephone Encounter (Signed)
Called pt to inform her of UTI and treatment prescribed.  She did not answer. I left a voicemail stating that I am calling with test results and will send a Mychart message with detailed information. Mychart message sent.

## 2022-01-25 LAB — PANORAMA PRENATAL TEST FULL PANEL:PANORAMA TEST PLUS 5 ADDITIONAL MICRODELETIONS: FETAL FRACTION: 11

## 2022-02-01 ENCOUNTER — Encounter: Payer: Self-pay | Admitting: *Deleted

## 2022-02-03 ENCOUNTER — Ambulatory Visit: Payer: BC Managed Care – PPO

## 2022-02-10 ENCOUNTER — Encounter: Payer: BC Managed Care – PPO | Admitting: Family Medicine

## 2022-03-02 ENCOUNTER — Ambulatory Visit: Payer: BC Managed Care – PPO | Attending: Family Medicine

## 2022-03-02 ENCOUNTER — Ambulatory Visit: Payer: BC Managed Care – PPO | Admitting: *Deleted

## 2022-03-02 VITALS — BP 120/63 | HR 68

## 2022-03-02 DIAGNOSIS — Z3480 Encounter for supervision of other normal pregnancy, unspecified trimester: Secondary | ICD-10-CM | POA: Insufficient documentation

## 2022-03-02 DIAGNOSIS — O09292 Supervision of pregnancy with other poor reproductive or obstetric history, second trimester: Secondary | ICD-10-CM | POA: Insufficient documentation

## 2022-03-02 DIAGNOSIS — Z363 Encounter for antenatal screening for malformations: Secondary | ICD-10-CM | POA: Diagnosis not present

## 2022-03-02 DIAGNOSIS — Z3A24 24 weeks gestation of pregnancy: Secondary | ICD-10-CM | POA: Diagnosis not present

## 2022-03-04 ENCOUNTER — Other Ambulatory Visit: Payer: Self-pay | Admitting: *Deleted

## 2022-03-04 DIAGNOSIS — Z362 Encounter for other antenatal screening follow-up: Secondary | ICD-10-CM

## 2022-03-17 ENCOUNTER — Other Ambulatory Visit: Payer: Self-pay

## 2022-03-17 ENCOUNTER — Ambulatory Visit (INDEPENDENT_AMBULATORY_CARE_PROVIDER_SITE_OTHER): Payer: BC Managed Care – PPO | Admitting: Obstetrics and Gynecology

## 2022-03-17 VITALS — BP 112/60 | HR 55 | Wt 153.0 lb

## 2022-03-17 DIAGNOSIS — Z3482 Encounter for supervision of other normal pregnancy, second trimester: Secondary | ICD-10-CM

## 2022-03-17 DIAGNOSIS — Z3480 Encounter for supervision of other normal pregnancy, unspecified trimester: Secondary | ICD-10-CM

## 2022-03-17 DIAGNOSIS — Z8759 Personal history of other complications of pregnancy, childbirth and the puerperium: Secondary | ICD-10-CM

## 2022-03-17 DIAGNOSIS — Z3A26 26 weeks gestation of pregnancy: Secondary | ICD-10-CM

## 2022-03-17 NOTE — Progress Notes (Signed)
   PRENATAL VISIT NOTE  Subjective:  Shannon Lyons is a 26 y.o. G2P1001 at 13w2dbeing seen today for ongoing prenatal care.  She is currently monitored for the following issues for this low-risk pregnancy and has Immature teratoma of ovary ([place]; History of salpingo-oophorectomy; History of pyelonephritis; History of gestational hypertension; and Supervision of other normal pregnancy, antepartum on their problem list.  Patient doing well with no acute concerns today. She reports no complaints.  Contractions: Not present. Vag. Bleeding: None.  Movement: Present. Denies leaking of fluid.   The following portions of the patient's history were reviewed and updated as appropriate: allergies, current medications, past family history, past medical history, past social history, past surgical history and problem list. Problem list updated.  Objective:   Vitals:   03/17/22 1002  BP: 112/60  Pulse: (!) 55  Weight: 153 lb (69.4 kg)    Fetal Status: Fetal Heart Rate (bpm): 145 Fundal Height: 26 cm Movement: Present     General:  Alert, oriented and cooperative. Patient is in no acute distress.  Skin: Skin is warm and dry. No rash noted.   Cardiovascular: Normal heart rate noted  Respiratory: Normal respiratory effort, no problems with respiration noted  Abdomen: Soft, gravid, appropriate for gestational age.  Pain/Pressure: Absent     Pelvic: Cervical exam deferred        Extremities: Normal range of motion.  Edema: None  Mental Status:  Normal mood and affect. Normal behavior. Normal judgment and thought content.   Assessment and Plan:  Pregnancy: G2P1001 at 250w2d1. [redacted] weeks gestation of pregnancy   2. History of gestational hypertension BP normal today  3. Supervision of other normal pregnancy, antepartum Continue routine prenatal care  Preterm labor symptoms and general obstetric precautions including but not limited to vaginal bleeding, contractions, leaking of fluid and fetal  movement were reviewed in detail with the patient.  Please refer to After Visit Summary for other counseling recommendations.   Return in about 2 weeks (around 03/31/2022) for ROB, in person, 2 hr GTT, 3rd trim labs.   LaLynnda ShieldsMD Faculty Attending Center for [facility]

## 2022-03-30 ENCOUNTER — Encounter: Payer: Self-pay | Admitting: *Deleted

## 2022-03-30 ENCOUNTER — Ambulatory Visit: Payer: BC Managed Care – PPO | Admitting: *Deleted

## 2022-03-30 ENCOUNTER — Ambulatory Visit: Payer: BC Managed Care – PPO | Attending: Obstetrics and Gynecology

## 2022-03-30 VITALS — BP 122/66 | HR 76

## 2022-03-30 DIAGNOSIS — O09293 Supervision of pregnancy with other poor reproductive or obstetric history, third trimester: Secondary | ICD-10-CM | POA: Diagnosis not present

## 2022-03-30 DIAGNOSIS — Z362 Encounter for other antenatal screening follow-up: Secondary | ICD-10-CM | POA: Diagnosis present

## 2022-03-30 DIAGNOSIS — Z3480 Encounter for supervision of other normal pregnancy, unspecified trimester: Secondary | ICD-10-CM

## 2022-03-30 DIAGNOSIS — Z363 Encounter for antenatal screening for malformations: Secondary | ICD-10-CM | POA: Diagnosis not present

## 2022-03-30 DIAGNOSIS — Z3A28 28 weeks gestation of pregnancy: Secondary | ICD-10-CM | POA: Diagnosis not present

## 2022-03-31 ENCOUNTER — Other Ambulatory Visit: Payer: Self-pay | Admitting: *Deleted

## 2022-03-31 DIAGNOSIS — Z8759 Personal history of other complications of pregnancy, childbirth and the puerperium: Secondary | ICD-10-CM

## 2022-04-01 ENCOUNTER — Other Ambulatory Visit: Payer: Self-pay | Admitting: *Deleted

## 2022-04-01 DIAGNOSIS — Z3480 Encounter for supervision of other normal pregnancy, unspecified trimester: Secondary | ICD-10-CM

## 2022-04-05 ENCOUNTER — Ambulatory Visit (INDEPENDENT_AMBULATORY_CARE_PROVIDER_SITE_OTHER): Payer: BC Managed Care – PPO | Admitting: Family Medicine

## 2022-04-05 ENCOUNTER — Other Ambulatory Visit: Payer: Self-pay

## 2022-04-05 ENCOUNTER — Other Ambulatory Visit: Payer: BC Managed Care – PPO

## 2022-04-05 VITALS — BP 126/72 | HR 73 | Wt 158.8 lb

## 2022-04-05 DIAGNOSIS — Z23 Encounter for immunization: Secondary | ICD-10-CM | POA: Diagnosis not present

## 2022-04-05 DIAGNOSIS — Z3480 Encounter for supervision of other normal pregnancy, unspecified trimester: Secondary | ICD-10-CM

## 2022-04-05 DIAGNOSIS — Z3A29 29 weeks gestation of pregnancy: Secondary | ICD-10-CM

## 2022-04-05 NOTE — Progress Notes (Signed)
   PRENATAL VISIT NOTE  Subjective:  Shannon Lyons is a 26 y.o. G2P1001 at 33w0dbeing seen today for ongoing prenatal care.  She is currently monitored for the following issues for this low-risk pregnancy and has Immature teratoma of ovary ([place]; History of salpingo-oophorectomy; History of pyelonephritis; History of gestational hypertension; and Supervision of other normal pregnancy, antepartum on their problem list.  Patient reports no complaints.   Contractions: Not present. Vag. Bleeding: None.  Movement: Present. Denies leaking of fluid.   The following portions of the patient's history were reviewed and updated as appropriate: allergies, current medications, past family history, past medical history, past social history, past surgical history and problem list.   Objective:   Vitals:   04/05/22 0912  BP: 126/72  Pulse: 73  Weight: 158 lb 12.8 oz (72 kg)    Fetal Status: Fetal Heart Rate (bpm): 145   Movement: Present     General:  Alert, oriented and cooperative. Patient is in no acute distress.  Skin: Skin is warm and dry. No rash noted.   Cardiovascular: Normal heart rate noted  Respiratory: Normal respiratory effort, no problems with respiration noted  Abdomen: Soft, gravid, appropriate for gestational age.  Pain/Pressure: Absent     Pelvic: Cervical exam deferred        Extremities: Normal range of motion.  Edema: None  Mental Status: Normal mood and affect. Normal behavior. Normal judgment and thought content.   Assessment and Plan:  Pregnancy: G2P1001 at 269w0d1. Supervision of other normal pregnancy, antepartum   Nursing Staff Provider  Office Location  CWFlournoyating  LMP  PNGalleria Surgery Center LLCodel '[ ]'$  Traditional [x ] Centering '[ ]'$  Mom-Baby Dyad    Language  English Anatomy USKoreanml  Flu Vaccine  declined Genetic/Carrier Screen  NIPS:   LR AFP:   Neg Horizon:LR   TDaP Vaccine   04/05/2022 Hgb A1C or  GTT Early  Third trimester   COVID Vaccine No   LAB RESULTS    Rhogam  N/a Blood Type O/Positive/-- (08/28 1552)   Baby Feeding Plan Both? Antibody Negative (08/28 1552)  Contraception Nexplanon Rubella 4.47 (08/28 1552)  Circumcision No RPR Non Reactive (08/28 1552)   PeEmersonBsAg Negative (08/28 1552)   Support Person Kevin(FOB) HCVAb   Prenatal Classes  HIV Non Reactive (08/28 1552)     BTL Consent NA GBS   (For PCN allergy, check sensitivities)   VBAC Consent NA Pap        DME Rx [XValu.Nieves BP cuff,has own '[ ]'$  Weight Scale Waterbirth  '[ ]'$  Class '[ ]'$  Consent '[ ]'$  CNM visit  PHQ9 & GAD7 [  ] new OB [  ] 28 weeks  [  ] 36 weeks Induction  '[ ]'$  Orders Entered '[ ]'$ Foley Y/N    Preterm labor symptoms and general obstetric precautions including but not limited to vaginal bleeding, contractions, leaking of fluid and fetal movement were reviewed in detail with the patient. Please refer to After Visit Summary for other counseling recommendations.    Future Appointments  Date Time Provider DeSibley11/13/2023 10:00 AM WMC-WOCA LAB WMEn[facility] Health [facility] VinelandME[facility]12/10/2021  3:15 PM WMC-MFC NURSE WMC-MFC WMOsu [facility]12/10/2021  3:30 PM WMC-MFC US3 WMC-MFCUS WMThompsonvilleDO FMOB Fellow, Faculty practice CoClearmontor WoAspen Surgery Centerealthcare 04/05/22  9:21 AM

## 2022-04-05 NOTE — Addendum Note (Signed)
Addended by: Donell Beers T on: 04/05/2022 09:36 AM   Modules accepted: Orders

## 2022-04-06 LAB — CBC
Hematocrit: 37.2 % (ref 34.0–46.6)
Hemoglobin: 12.5 g/dL (ref 11.1–15.9)
MCH: 29.1 pg (ref 26.6–33.0)
MCHC: 33.6 g/dL (ref 31.5–35.7)
MCV: 87 fL (ref 79–97)
Platelets: 226 10*3/uL (ref 150–450)
RBC: 4.3 x10E6/uL (ref 3.77–5.28)
RDW: 13.5 % (ref 11.7–15.4)
WBC: 9.9 10*3/uL (ref 3.4–10.8)

## 2022-04-06 LAB — HIV ANTIBODY (ROUTINE TESTING W REFLEX): HIV Screen 4th Generation wRfx: NONREACTIVE

## 2022-04-06 LAB — GLUCOSE TOLERANCE, 2 HOURS W/ 1HR
Glucose, 1 hour: 112 mg/dL (ref 70–179)
Glucose, 2 hour: 103 mg/dL (ref 70–152)
Glucose, Fasting: 79 mg/dL (ref 70–91)

## 2022-04-06 LAB — RPR: RPR Ser Ql: NONREACTIVE

## 2022-04-28 ENCOUNTER — Ambulatory Visit: Payer: BC Managed Care – PPO | Admitting: *Deleted

## 2022-04-28 ENCOUNTER — Ambulatory Visit: Payer: BC Managed Care – PPO | Attending: Obstetrics

## 2022-04-28 VITALS — BP 133/61 | HR 70

## 2022-04-28 DIAGNOSIS — O09293 Supervision of pregnancy with other poor reproductive or obstetric history, third trimester: Secondary | ICD-10-CM

## 2022-04-28 DIAGNOSIS — Z362 Encounter for other antenatal screening follow-up: Secondary | ICD-10-CM

## 2022-04-28 DIAGNOSIS — Z8759 Personal history of other complications of pregnancy, childbirth and the puerperium: Secondary | ICD-10-CM

## 2022-04-28 DIAGNOSIS — Z3A32 32 weeks gestation of pregnancy: Secondary | ICD-10-CM

## 2022-04-29 ENCOUNTER — Other Ambulatory Visit: Payer: Self-pay

## 2022-04-29 ENCOUNTER — Ambulatory Visit (INDEPENDENT_AMBULATORY_CARE_PROVIDER_SITE_OTHER): Payer: BC Managed Care – PPO | Admitting: Family Medicine

## 2022-04-29 VITALS — BP 120/77 | HR 76 | Wt 159.7 lb

## 2022-04-29 DIAGNOSIS — Z3A32 32 weeks gestation of pregnancy: Secondary | ICD-10-CM

## 2022-04-29 DIAGNOSIS — Z9079 Acquired absence of other genital organ(s): Secondary | ICD-10-CM

## 2022-04-29 DIAGNOSIS — Z90721 Acquired absence of ovaries, unilateral: Secondary | ICD-10-CM

## 2022-04-29 DIAGNOSIS — Z8759 Personal history of other complications of pregnancy, childbirth and the puerperium: Secondary | ICD-10-CM

## 2022-04-29 DIAGNOSIS — Z3483 Encounter for supervision of other normal pregnancy, third trimester: Secondary | ICD-10-CM

## 2022-04-29 DIAGNOSIS — Z3480 Encounter for supervision of other normal pregnancy, unspecified trimester: Secondary | ICD-10-CM

## 2022-04-29 NOTE — Patient Instructions (Signed)
Here is a list of medications safe to take during pregnancy.   Safe Medications in Pregnancy   Acne:  Benzoyl Peroxide  Salicylic Acid   Backache/Headache:  Tylenol: 2 regular strength every 4 hours OR               2 Extra strength every 6 hours   Colds/Coughs/Allergies:  Benadryl (alcohol free) 25 mg every 6 hours as needed  Breath right strips  Claritin  Cepacol throat lozenges  Chloraseptic throat spray  Cold-Eeze- up to three times per day  Cough drops, alcohol free  Flonase (by prescription only)  Guaifenesin  Mucinex  Robitussin DM (plain only, alcohol free)  Saline nasal spray/drops  Sudafed (pseudoephedrine) & Actifed * use only after [redacted] weeks gestation and if you do not have high blood pressure  Tylenol  Vicks Vaporub  Zinc lozenges  Zyrtec   Constipation:  Colace  Ducolax suppositories  Fleet enema  Glycerin suppositories  Metamucil  Milk of magnesia  Miralax  Senokot  Smooth move tea   Diarrhea:  Kaopectate  Imodium A-D   *NO pepto Bismol   Hemorrhoids:  Anusol  Anusol HC  Preparation H  Tucks   Indigestion:  Tums  Maalox  Mylanta  Zantac  Pepcid   Insomnia:  Benadryl (alcohol free) '25mg'$  every 6 hours as needed  Tylenol PM  Unisom, no Gelcaps   Leg Cramps:  Tums  MagGel   Nausea/Vomiting:  Bonine  Dramamine  Emetrol  Ginger extract  Sea bands  Meclizine  Nausea medication to take during pregnancy:  Unisom (doxylamine succinate 25 mg tablets) Take one tablet daily at bedtime. If symptoms are not adequately controlled, the dose can be increased to a maximum recommended dose of two tablets daily (1/2 tablet in the morning, 1/2 tablet mid-afternoon and one at bedtime).  Vitamin B6 '100mg'$  tablets. Take one tablet twice a day (up to 200 mg per day).   Skin Rashes:  Aveeno products  Benadryl cream or '25mg'$  every 6 hours as needed  Calamine Lotion  1% cortisone cream   Yeast infection:  Gyne-lotrimin 7  Monistat 7    **If  taking multiple medications, please check labels to avoid duplicating the same active ingredients  **take medication as directed on the label  ** Do not exceed 4000 mg of tylenol in 24 hours  **Do not take medications that contain aspirin or ibuprofen

## 2022-04-30 NOTE — Progress Notes (Signed)
   PRENATAL VISIT NOTE  Subjective:  Shannon Lyons is a 26 y.o. G2P1001 at 52w4dbeing seen today for ongoing prenatal care.  She is currently monitored for the following issues for this low-risk pregnancy and has Immature teratoma of ovary ([place]; History of salpingo-oophorectomy; History of pyelonephritis; History of gestational hypertension; and Supervision of other normal pregnancy, antepartum on their problem list.  Patient reports no bleeding, no contractions, no cramping, and no leaking.  Contractions: Irregular. Vag. Bleeding: None.  Movement: Present. Denies leaking of fluid.   The following portions of the patient's history were reviewed and updated as appropriate: allergies, current medications, past family history, past medical history, past social history, past surgical history and problem list.   Objective:   Vitals:   04/29/22 1500  BP: 120/77  Pulse: 76  Weight: 159 lb 11.2 oz (72.4 kg)    Fetal Status: Fetal Heart Rate (bpm): 143   Movement: Present     General:  Alert, oriented and cooperative. Patient is in no acute distress.  Skin: Skin is warm and dry. No rash noted.   Cardiovascular: Normal heart rate noted  Respiratory: Normal respiratory effort, no problems with respiration noted  Abdomen: Soft, gravid, appropriate for gestational age.  Pain/Pressure: Absent     Pelvic: Cervical exam deferred        Extremities: Normal range of motion.  Edema: None  Mental Status: Normal mood and affect. Normal behavior. Normal judgment and thought content.   Assessment and Plan:  Pregnancy: G2P1001 at 326w4d. Supervision of other normal pregnancy, antepartum Continue routine prenatal care   2. History of gestational hypertension BP normal today   3. [redacted] weeks gestation of pregnancy  4. History of salpingo-oophorectomy  Preterm labor symptoms and general obstetric precautions including but not limited to vaginal bleeding, contractions, leaking of fluid and fetal  movement were reviewed in detail with the patient. Please refer to After Visit Summary for other counseling recommendations.   No follow-ups on file.  Future Appointments  Date Time Provider DeIron River1/08/2022  2:35 PM NdStormy CardMD WMBibb Medical CenterMRoanoke Surgery [facility] LP1/04/2023  8:55 AM PiAletha HalimMD WMMissouri Baptist Medical CenterMCentral [facility]1/18/2024  8:55 AM BaGriffin BasilMD WMMerwick [facility] Nursing [facility] CenterMKind[facility]1/25/2024  8:55 AM ArWoodroe ModeMD WMSurgery Center [facility]ull[facility] LLCMLour[facility]  JoConcepcion LivingMD

## 2022-05-10 ENCOUNTER — Encounter: Payer: Self-pay | Admitting: *Deleted

## 2022-05-18 ENCOUNTER — Inpatient Hospital Stay (HOSPITAL_COMMUNITY)
Admission: AD | Admit: 2022-05-18 | Discharge: 2022-05-18 | Disposition: A | Discharge status: Home / Self Care | Payer: BC Managed Care – PPO | Attending: Family Medicine | Admitting: Family Medicine

## 2022-05-18 ENCOUNTER — Encounter (HOSPITAL_COMMUNITY): Payer: Self-pay | Admitting: Family Medicine

## 2022-05-18 DIAGNOSIS — O10913 Unspecified pre-existing hypertension complicating pregnancy, third trimester: Secondary | ICD-10-CM | POA: Diagnosis not present

## 2022-05-18 DIAGNOSIS — O26893 Other specified pregnancy related conditions, third trimester: Secondary | ICD-10-CM | POA: Diagnosis present

## 2022-05-18 DIAGNOSIS — N898 Other specified noninflammatory disorders of vagina: Secondary | ICD-10-CM

## 2022-05-18 DIAGNOSIS — O368130 Decreased fetal movements, third trimester, not applicable or unspecified: Secondary | ICD-10-CM | POA: Diagnosis not present

## 2022-05-18 DIAGNOSIS — R102 Pelvic and perineal pain: Secondary | ICD-10-CM | POA: Diagnosis present

## 2022-05-18 DIAGNOSIS — Z3A35 35 weeks gestation of pregnancy: Secondary | ICD-10-CM | POA: Insufficient documentation

## 2022-05-18 DIAGNOSIS — Z3480 Encounter for supervision of other normal pregnancy, unspecified trimester: Secondary | ICD-10-CM

## 2022-05-18 LAB — WET PREP, GENITAL
Clue Cells Wet Prep HPF POC: NONE SEEN
Sperm: NONE SEEN
Trich, Wet Prep: NONE SEEN
WBC, Wet Prep HPF POC: >=10 — AB (ref <10)
Yeast Wet Prep HPF POC: NONE SEEN

## 2022-05-18 LAB — URINALYSIS, ROUTINE W REFLEX MICROSCOPIC
Bilirubin Urine: NEGATIVE
Glucose, UA: NEGATIVE mg/dL
Hgb urine dipstick: NEGATIVE
Ketones, ur: NEGATIVE mg/dL
Nitrite: NEGATIVE
Protein, ur: NEGATIVE mg/dL
Specific Gravity, Urine: 1.009 (ref 1.005–1.030)
pH: 6 (ref 5.0–8.0)

## 2022-05-18 LAB — POCT FERN TEST: POCT Fern Test: NEGATIVE

## 2022-05-18 NOTE — MAU Provider Note (Signed)
History     [id number]  Arrival date and time: 05/18/22 1445    Chief Complaint  Patient presents with   Pelvic Pain   Decreased Fetal Movement     HPI Shannon Lyons is a 26 y.o. at 22w1dby LMP , who presents for decreased fetal movement.  She notes that the baby is moving, but not as much as before.  This started last night and since she has been here she notes the movement has improved.  She denies contractions, some pelvic pressure.  She also notes leaking fluid- denies itching or irritation.  No other acute complaints.   Pregnancy uncomplicated. H/o gestational HTN with first pregnancy  OBhx: FTNSVD- IOL due to gestational HTN   Past Medical History:  Diagnosis Date   Hypertension    PONV (postoperative nausea and vomiting)    Pregnancy induced hypertension    Pyelonephritis 02/2015   UTI (lower urinary tract infection)     Past Surgical History:  Procedure Laterality Date   TUMOR REMOVAL  2012   tumor removed from near right ovary; ovary and tube removed per pt    Family History  Problem Relation Age of Onset   Diabetes Maternal Uncle    Diabetes Maternal Grandfather    Cancer Paternal Grandfather    Asthma Neg Hx    Heart disease Neg Hx    Hypertension Neg Hx    Stroke Neg Hx     No Known Allergies  No current facility-administered medications on file prior to encounter.   Current Outpatient Medications on File Prior to Encounter  Medication Sig Dispense Refill   aspirin EC 81 MG tablet Take 1 tablet (81 mg total) by mouth daily. Swallow whole. 30 tablet 12   Prenatal Vit-Fe Fumarate-FA (PRENATAL PO) Take 1 tablet by mouth daily.       Review of Systems  Constitutional:  Negative for chills and fever.  HENT: Negative.    Respiratory: Negative.    Cardiovascular: Negative.   Gastrointestinal:  Positive for nausea. Negative for abdominal pain, constipation, diarrhea and vomiting.  Genitourinary: Negative.   Musculoskeletal: Negative.    Skin: Negative.   Neurological:  Negative for dizziness and headaches.  Psychiatric/Behavioral:  Negative for depression. The patient is not nervous/anxious.    Pertinent positives and negative per HPI, all others reviewed and negative  Physical Exam   BP 133/76   Pulse (!) 109   Temp 99 F (37.2 C) (Oral)   Resp 15   Ht '5\' 3"'$  (1.6 m)   Wt 75.3 kg   LMP 09/14/2021 (Exact Date)   SpO2 99%   BMI 29.41 kg/m   Patient Vitals for the past 24 hrs:  BP Temp Temp src Pulse Resp SpO2 Height Weight  05/18/22 1537 133/76 -- -- (!) 109 -- -- -- --  05/18/22 1503 127/73 99 F (37.2 C) Oral (!) 121 15 99 % '5\' 3"'$  (1.6 m) 75.3 kg    Physical Exam Constitutional:      Appearance: Normal appearance.  Cardiovascular:     Rate and Rhythm: Normal rate.  Pulmonary:     Effort: Pulmonary effort is normal.     Breath sounds: Normal breath sounds.  Abdominal:     Comments: Gravid, non-tender  Genitourinary:    Comments: Normal external genitalia, vagina-pink mucosa- minimal white discharge noted.  Cervix visualized- no dilation noted.  No pooling. Musculoskeletal:        General: No swelling or tenderness.  Skin:  General: Skin is warm and dry.  Neurological:     Mental Status: She is alert and oriented to person, place, and time.  Psychiatric:        Mood and Affect: Mood normal.        Behavior: Behavior normal.      Cervical Exam Dilation: Closed Exam by:: Dr. Nelda Marseille, MD   [facility] Baseline 150, moderate variability, +15x15 accels x 2, no decels Toco: occasional irritability Cat: I- Reactive NST  Labs Results for orders placed or performed during the hospital encounter of 05/18/22 (from the past 24 hour(s))  Urinalysis, Routine w reflex microscopic Urine, Clean Catch     Status: Abnormal   Collection Time: 05/18/22  3:07 PM  Result Value Ref Range   Color, Urine YELLOW YELLOW   APPearance HAZY (A) CLEAR   Specific Gravity, Urine 1.009 1.005 - 1.030   pH 6.0 5.0 - 8.0    Glucose, UA NEGATIVE NEGATIVE mg/dL   Hgb urine dipstick NEGATIVE NEGATIVE   Bilirubin Urine NEGATIVE NEGATIVE   Ketones, ur NEGATIVE NEGATIVE mg/dL   Protein, ur NEGATIVE NEGATIVE mg/dL   Nitrite NEGATIVE NEGATIVE   Leukocytes,Ua SMALL (A) NEGATIVE   RBC / HPF 0-5 0 - 5 RBC/hpf   WBC, UA 6-10 0 - 5 WBC/hpf   Bacteria, UA RARE (A) NONE SEEN   Squamous Epithelial / LPF 11-20 0 - 5 /HPF  Wet prep, genital     Status: Abnormal   Collection Time: 05/18/22  3:37 PM   Specimen: Cervix  Result Value Ref Range   Yeast Wet Prep HPF POC NONE SEEN NONE SEEN   Trich, Wet Prep NONE SEEN NONE SEEN   Clue Cells Wet Prep HPF POC NONE SEEN NONE SEEN   WBC, Wet Prep HPF POC >=10 (A) <10   Sperm NONE SEEN     Imaging No results found.  MAU Course  Procedures Lab Orders         Wet prep, genital         Urinalysis, Routine w reflex microscopic Urine, Clean Catch         Fern Test    No orders of the defined types were placed in this encounter.  Imaging Orders  No imaging studies ordered today    MDM -Reactive NST -Pt ruled out for rupture or labor- physical exam as above.  Cervix closed, no abnormalities noted -Pt noted good fetal movement -UA and wet mount negative  Assessment and Plan   1. Supervision of other normal pregnancy, antepartum   2. Decreased fetal movements in third trimester, single or unspecified fetus   3. Vaginal discharge during pregnancy in third trimester    No evidence of labor, rupture or infection Discharged home in stable conditions Reviewed fetal kick counts and plan to follow up as scheduled  Janyth Pupa, DO Attending [place], [place] for [employer], [place]

## 2022-05-18 NOTE — MAU Note (Addendum)
...  Shannon Lyons is a 26 y.o. at 9w1dhere in MAU reporting: DFM since this morning around 0830. She reports she has not felt any fetal movement since then. She is also endorsing constant pelvic pain that is sharp and worsens with movement for the past three days. Unsure if her water broke. She reports she has been leaking clear fluid around once an hour since yesterday. Denies VB.  Fetal movement palpated upon assessing for FHT with the monitors. Patient endorses feeling the movement as well.  Fetal movement clicker given.  Next OB appointment 1/4.  Onset of complaint:  Pain score: 7/10 pelvis  FHT: 165 initial external Lab orders placed from triage:  UA

## 2022-05-24 NOTE — L&D Delivery Note (Signed)
OB/GYN Faculty Practice Delivery Note  Shannon Lyons is a 27 y.o. G2P1001 s/p SVD at [redacted]w[redacted]d She was admitted for IOL for gHTN.   ROM: 3h 038mith clear fluid GBS Status: Positive/-- (01/04 1641) Maximum Maternal Temperature: Temp (24hrs), Avg:98 F (36.7 C), Min:97.6 F (36.4 C), Max:98.1 F (36.7 C)    Labor Progress: Patient arrived at 0.5 cm dilation and was induced with cytotec, pit, AROM.   Delivery Date/Time: 06/05/2022 at 1238 Delivery: Called to room and patient was complete and pushing. Head delivered in LOA position. No nuchal cord present. Shoulder and body delivered in usual fashion. Infant with spontaneous cry, placed on mother's abdomen, dried and stimulated. Cord clamped x 2 after 1-minute delay, and cut by FOB. Cord blood drawn. Placenta delivered spontaneously with gentle cord traction. Fundus firm with massage and Pitocin. Labia, perineum, vagina, and cervix inspected with no lacerations.   Placenta: Spontaneous, intact, 3 vessel cord, true knot  Complications: None Lacerations: None EBL: 78 mL Analgesia: Epidural in place    Infant: APGAR (1 MIN): 9   APGAR (5 MINS): 9   APGAR (10 MINS):    Weight: Pending   Shannon ShaveMD  OB Fellow  06/05/2022 12:55 PM

## 2022-05-27 ENCOUNTER — Other Ambulatory Visit: Payer: Self-pay

## 2022-05-27 ENCOUNTER — Ambulatory Visit (INDEPENDENT_AMBULATORY_CARE_PROVIDER_SITE_OTHER): Payer: BC Managed Care – PPO | Admitting: Family Medicine

## 2022-05-27 ENCOUNTER — Other Ambulatory Visit (HOSPITAL_COMMUNITY)
Admission: RE | Admit: 2022-05-27 | Discharge: 2022-05-27 | Disposition: A | Discharge status: Home / Self Care | Payer: BC Managed Care – PPO | Source: Ambulatory Visit | Attending: Family Medicine | Admitting: Family Medicine

## 2022-05-27 VITALS — BP 122/91 | HR 76 | Wt 163.0 lb

## 2022-05-27 DIAGNOSIS — Z3483 Encounter for supervision of other normal pregnancy, third trimester: Secondary | ICD-10-CM

## 2022-05-27 DIAGNOSIS — Z3A36 36 weeks gestation of pregnancy: Secondary | ICD-10-CM | POA: Diagnosis present

## 2022-05-27 DIAGNOSIS — L905 Scar conditions and fibrosis of skin: Secondary | ICD-10-CM

## 2022-05-27 DIAGNOSIS — R030 Elevated blood-pressure reading, without diagnosis of hypertension: Secondary | ICD-10-CM

## 2022-05-27 DIAGNOSIS — Z8759 Personal history of other complications of pregnancy, childbirth and the puerperium: Secondary | ICD-10-CM

## 2022-05-27 DIAGNOSIS — Z3480 Encounter for supervision of other normal pregnancy, unspecified trimester: Secondary | ICD-10-CM

## 2022-05-27 DIAGNOSIS — O26843 Uterine size-date discrepancy, third trimester: Secondary | ICD-10-CM | POA: Insufficient documentation

## 2022-05-27 NOTE — Progress Notes (Signed)
   PRENATAL VISIT NOTE  Subjective:  Kyerra Vargo is a 27 y.o. G2P1001 at 31w3dbeing seen today for ongoing prenatal care.  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  Patient reports no complaints.  Contractions: Irritability. Vag. Bleeding: None.  Movement: Present. Denies leaking of fluid.   The following portions of the patient's history were reviewed and updated as appropriate: allergies, current medications, past family history, past medical history, past social history, past surgical history and problem list.   Objective:   Vitals:   05/27/22 1441 05/27/22 1518  BP: (!) 129/93 (!) 122/91  Pulse: 70 76  Weight: 163 lb (73.9 kg)     Fetal Status: Fetal Heart Rate (bpm): 149 Fundal Height: 29 cm Movement: Present     General:  Alert, oriented and cooperative. Patient is in no acute distress.  Skin: Skin is warm and dry. No rash noted.   Cardiovascular: Normal heart rate noted  Respiratory: Normal respiratory effort, no problems with respiration noted  Abdomen: Soft, gravid, appropriate for gestational age.  Pain/Pressure: Present  vertex presentation per [place]   Pelvic: Cervical exam deferred        Extremities: Normal range of motion.  Edema: None  Mental Status: Normal mood and affect. Normal behavior. Normal judgment and thought content.   Assessment and Plan:  Pregnancy: G2P1001 at 389w3d. Supervision of other normal pregnancy, antepartum [redacted] weeks gestation of pregnancy - Plan: GC/Chlamydia probe amp ([place], beta strep (group b only) Doing well overall, no concerns today. GBS and GC/chlamydia swabs collected.  Counseled  patient about GBS.  2. History of gestational hypertension Elevated blood pressure noted today.  Numbers have been normal in the past.  She does have a history of hypertension and developed around 36-37 weeks in her last pregnancy. -Recommend blood pressure monitoring at home.  She does have baby scripts. -Follow-up in 1 week  -Discussed ED precautions and signs and symptoms of high blood pressure/preeclampsia.  Fundal height low for dates in third trimester - Plan: [place]FM OB FOLLOW UP Fundal height noted to be about 29 cm today.  Last ultrasound at about 32 weeks, showed normal growth.  She does have a long vertical midline abdominal scar from her salpingo-oophorectomy in the past.  Possibly limiting normal expansion of her abdomen and affecting fundal height. -Follow-up growth scan ordered.  Midline scar of abdomen Status post salpingo-oophorectomy of a large ovarian teratoma  Term labor symptoms and general obstetric precautions including but not limited to vaginal bleeding, contractions, leaking of fluid and fetal movement were reviewed in detail with the patient. Please refer to After Visit Summary for other counseling recommendations.   Return in 1 week (on 06/03/2022) for lob.  Future Appointments  Date Time Provider [place]1/09/2022  1:45 PM [facility] LPURSE [facility]1/09/2022  2:00 PM WMC-MFC US7 WMC-MFCUS [facility]1/04/2023  8:55 AM PiAletha HalimMD WMPut[facility] CenterMD[facility]1/18/2024  8:55 AM BaGriffin BasilMD WMS[facility]1/25/2024  8:55 AM ArWoodroe ModeMD WMCook [facility]MF[facility] ChLiliane ChannelD MPH OB Fellow, [place] WoSt[place]/08/2022

## 2022-05-28 ENCOUNTER — Ambulatory Visit: Payer: BC Managed Care – PPO | Admitting: *Deleted

## 2022-05-28 ENCOUNTER — Ambulatory Visit: Payer: BC Managed Care – PPO | Attending: Obstetrics

## 2022-05-28 VITALS — BP 123/84 | HR 75

## 2022-05-28 DIAGNOSIS — Z3480 Encounter for supervision of other normal pregnancy, unspecified trimester: Secondary | ICD-10-CM

## 2022-05-28 DIAGNOSIS — Z3A36 36 weeks gestation of pregnancy: Secondary | ICD-10-CM | POA: Diagnosis not present

## 2022-05-28 DIAGNOSIS — O09293 Supervision of pregnancy with other poor reproductive or obstetric history, third trimester: Secondary | ICD-10-CM | POA: Insufficient documentation

## 2022-05-28 DIAGNOSIS — O26843 Uterine size-date discrepancy, third trimester: Secondary | ICD-10-CM | POA: Diagnosis present

## 2022-05-28 LAB — GC/CHLAMYDIA PROBE AMP (~~LOC~~) NOT AT ARMC
Chlamydia: NEGATIVE
Comment: NEGATIVE
Comment: NORMAL
Neisseria Gonorrhea: NEGATIVE

## 2022-05-30 LAB — CULTURE, BETA STREP (GROUP B ONLY): Strep Gp B Culture: POSITIVE — AB

## 2022-06-04 ENCOUNTER — Encounter (HOSPITAL_COMMUNITY): Payer: Self-pay | Admitting: Obstetrics and Gynecology

## 2022-06-04 ENCOUNTER — Other Ambulatory Visit: Payer: Self-pay

## 2022-06-04 ENCOUNTER — Ambulatory Visit (INDEPENDENT_AMBULATORY_CARE_PROVIDER_SITE_OTHER): Payer: BC Managed Care – PPO | Admitting: Obstetrics and Gynecology

## 2022-06-04 ENCOUNTER — Inpatient Hospital Stay (HOSPITAL_COMMUNITY)
Admission: AD | Admit: 2022-06-04 | Discharge: 2022-06-06 | DRG: 807 | Disposition: A | Discharge status: Home / Self Care | Payer: BC Managed Care – PPO | Attending: Obstetrics and Gynecology | Admitting: Obstetrics and Gynecology

## 2022-06-04 ENCOUNTER — Encounter: Payer: Self-pay | Admitting: Obstetrics and Gynecology

## 2022-06-04 VITALS — BP 135/91 | HR 64 | Wt 164.4 lb

## 2022-06-04 DIAGNOSIS — O133 Gestational [pregnancy-induced] hypertension without significant proteinuria, third trimester: Secondary | ICD-10-CM | POA: Insufficient documentation

## 2022-06-04 DIAGNOSIS — O139 Gestational [pregnancy-induced] hypertension without significant proteinuria, unspecified trimester: Secondary | ICD-10-CM | POA: Insufficient documentation

## 2022-06-04 DIAGNOSIS — Z8543 Personal history of malignant neoplasm of ovary: Secondary | ICD-10-CM

## 2022-06-04 DIAGNOSIS — Z7982 Long term (current) use of aspirin: Secondary | ICD-10-CM | POA: Diagnosis not present

## 2022-06-04 DIAGNOSIS — O134 Gestational [pregnancy-induced] hypertension without significant proteinuria, complicating childbirth: Principal | ICD-10-CM | POA: Diagnosis present

## 2022-06-04 DIAGNOSIS — O99824 Streptococcus B carrier state complicating childbirth: Secondary | ICD-10-CM | POA: Diagnosis present

## 2022-06-04 DIAGNOSIS — Z3480 Encounter for supervision of other normal pregnancy, unspecified trimester: Secondary | ICD-10-CM

## 2022-06-04 DIAGNOSIS — Z3A37 37 weeks gestation of pregnancy: Secondary | ICD-10-CM | POA: Diagnosis not present

## 2022-06-04 DIAGNOSIS — Z8759 Personal history of other complications of pregnancy, childbirth and the puerperium: Secondary | ICD-10-CM

## 2022-06-04 DIAGNOSIS — Z9079 Acquired absence of other genital organ(s): Secondary | ICD-10-CM

## 2022-06-04 LAB — CBC
HCT: 38 % (ref 36.0–46.0)
Hemoglobin: 13 g/dL (ref 12.0–15.0)
MCH: 28.8 pg (ref 26.0–34.0)
MCHC: 34.2 g/dL (ref 30.0–36.0)
MCV: 84.1 fL (ref 80.0–100.0)
Platelets: 282 10*3/uL (ref 150–400)
RBC: 4.52 MIL/uL (ref 3.87–5.11)
RDW: 13.4 % (ref 11.5–15.5)
WBC: 8.4 10*3/uL (ref 4.0–10.5)
nRBC: 0 % (ref 0.0–0.2)

## 2022-06-04 LAB — COMPREHENSIVE METABOLIC PANEL
ALT: 15 U/L (ref 0–44)
AST: 27 U/L (ref 15–41)
Albumin: 3.2 g/dL — ABNORMAL LOW (ref 3.5–5.0)
Alkaline Phosphatase: 153 U/L — ABNORMAL HIGH (ref 38–126)
Anion gap: 10 (ref 5–15)
BUN: 9 mg/dL (ref 6–20)
CO2: 19 mmol/L — ABNORMAL LOW (ref 22–32)
Calcium: 9.5 mg/dL (ref 8.9–10.3)
Chloride: 106 mmol/L (ref 98–111)
Creatinine, Ser: 0.73 mg/dL (ref 0.44–1.00)
GFR, Estimated: >60 mL/min (ref >60)
Glucose, Bld: 95 mg/dL (ref 70–99)
Potassium: 3.9 mmol/L (ref 3.5–5.1)
Sodium: 135 mmol/L (ref 135–145)
Total Bilirubin: 0.2 mg/dL — ABNORMAL LOW (ref 0.3–1.2)
Total Protein: 7.1 g/dL (ref 6.5–8.1)

## 2022-06-04 LAB — TYPE AND SCREEN
ABO/RH(D): O POS
Antibody Screen: NEGATIVE

## 2022-06-04 MED ORDER — OXYTOCIN BOLUS FROM INFUSION
333.0000 mL | Freq: Once | INTRAVENOUS | Status: AC
  Administered 2022-06-05: 333 mL via INTRAVENOUS

## 2022-06-04 MED ORDER — LACTATED RINGERS IV SOLN: INTRAVENOUS | Status: DC

## 2022-06-04 MED ORDER — OXYCODONE-ACETAMINOPHEN 5-325 MG PO TABS: 1.0000 | ORAL_TABLET | ORAL | Status: DC | PRN

## 2022-06-04 MED ORDER — ONDANSETRON HCL 4 MG/2ML IJ SOLN
4.0000 mg | Freq: Four times a day (QID) | INTRAMUSCULAR | Status: DC | PRN
  Administered 2022-06-05: 4 mg via INTRAVENOUS
  Filled 2022-06-04: qty 2

## 2022-06-04 MED ORDER — OXYCODONE-ACETAMINOPHEN 5-325 MG PO TABS: 2.0000 | ORAL_TABLET | ORAL | Status: DC | PRN

## 2022-06-04 MED ORDER — MISOPROSTOL 50MCG HALF TABLET
50.0000 ug | ORAL_TABLET | Freq: Once | ORAL | Status: AC
  Administered 2022-06-04: 50 ug via ORAL
  Filled 2022-06-04: qty 1

## 2022-06-04 MED ORDER — FLEET ENEMA 7-19 GM/118ML RE ENEM: 1.0000 | ENEMA | RECTAL | Status: DC | PRN

## 2022-06-04 MED ORDER — LIDOCAINE HCL (PF) 1 % IJ SOLN: 30.0000 mL | INTRAMUSCULAR | Status: DC | PRN

## 2022-06-04 MED ORDER — PENICILLIN G POT IN DEXTROSE 60000 UNIT/ML IV SOLN
3.0000 10*6.[IU] | INTRAVENOUS | Status: DC
  Administered 2022-06-05 (×3): 3 10*6.[IU] via INTRAVENOUS
  Filled 2022-06-04 (×5): qty 50

## 2022-06-04 MED ORDER — SODIUM CHLORIDE 0.9 % IV SOLN
5.0000 10*6.[IU] | Freq: Once | INTRAVENOUS | Status: AC
  Administered 2022-06-04: 5 10*6.[IU] via INTRAVENOUS
  Filled 2022-06-04: qty 5

## 2022-06-04 MED ORDER — MISOPROSTOL 25 MCG QUARTER TABLET
25.0000 ug | ORAL_TABLET | Freq: Once | ORAL | Status: AC
  Administered 2022-06-04: 25 ug via VAGINAL
  Filled 2022-06-04 (×2): qty 1

## 2022-06-04 MED ORDER — OXYTOCIN-SODIUM CHLORIDE 30-0.9 UT/500ML-% IV SOLN
2.5000 [IU]/h | INTRAVENOUS | Status: DC
  Administered 2022-06-05: 2.5 [IU]/h via INTRAVENOUS

## 2022-06-04 MED ORDER — ACETAMINOPHEN 325 MG PO TABS: 650.0000 mg | ORAL_TABLET | ORAL | Status: DC | PRN

## 2022-06-04 MED ORDER — TERBUTALINE SULFATE 1 MG/ML IJ SOLN: 0.2500 mg | Freq: Once | INTRAMUSCULAR | Status: DC | PRN

## 2022-06-04 MED ORDER — SOD CITRATE-CITRIC ACID 500-334 MG/5ML PO SOLN: 30.0000 mL | ORAL | Status: DC | PRN

## 2022-06-04 MED ORDER — LACTATED RINGERS IV SOLN
500.0000 mL | INTRAVENOUS | Status: DC | PRN
  Administered 2022-06-04: 1000 mL via INTRAVENOUS

## 2022-06-04 NOTE — Progress Notes (Addendum)
Brief note - Reviewed patient chart.   She is a 27 yo 06/999 for IOL d/t GHTN. BP is mild range or normotensive. Labs normal thus far. P/C ratio pending.   PNC and problem list reviewed.   Last [place] 1/5 was 71%EZB and cephalic.   Tracing reviewed - category 1. Normal baseline.   Radene Gunning, MD Attending [place], [facility] for [facility], [place]

## 2022-06-04 NOTE — H&P (Signed)
OBSTETRIC ADMISSION HISTORY AND PHYSICAL  Shannon Lyons is a 27 y.o. female G2P1001 with IUP at 53w4dby early UKoreapresenting for IOL for gHTN. She reports +FMs, No LOF, no VB, no blurry vision, headaches or peripheral edema, and RUQ pain.  She plans on breast feeding. She request nexplanon for birth control. She [facility] [place]--->  Estimated Date of Delivery: 06/21/22  Sono: '@[redacted]w[redacted]d'$ , CWD, normal anatomy, cephalic presentation, anterior placenta, 2719g, 28% EFW   Prenatal History/Complications:  gHTN - not on meds  Past Medical History: Past Medical History:  Diagnosis Date   Hypertension    PONV (postoperative nausea and vomiting)    Pregnancy induced hypertension    Pyelonephritis 02/2015   UTI (lower urinary tract infection)     Past Surgical History: Past Surgical History:  Procedure Laterality Date   TUMOR REMOVAL  2012   tumor removed from near right ovary; ovary and tube removed per pt    Obstetrical History: OB History     Gravida  2   Para  1   Term  1   Preterm  0   AB  0   Living  1      SAB  0   IAB  0   Ectopic  0   Multiple  0   Live Births  1           Social History Social History   Socioeconomic History   Marital status: Significant Other    Spouse name: Not on file   Number of children: Not on file   Years of education: Not on file   Highest education level: Not on file  Occupational History   Occupation: [employer] Tobacco Use   Smoking status: Never   Smokeless tobacco: Never  Vaping Use   Vaping Use: Never used  Substance and Sexual Activity   Alcohol use: Not Currently   Drug use: No   Sexual activity: Yes    Birth control/protection: None  Other Topics Concern   Not on file  Social History Narrative   Not on file   Social Determinants of Health   Financial Resource Strain: Not on file  Food Insecurity: No Food Insecurity (06/04/2022)    Hunger Vital Sign    Worried About Running Out of Food in the Last Year: Never true    Ran Out of Food in the Last Year: Never true  Transportation Needs: No Transportation Needs (06/04/2022)   PRAPARE - THydrologist(Medical): No    Lack of Transportation (Non-Medical): No  Physical Activity: Not on file  Stress: Not on file  Social Connections: Not on file    Family History: Family History  Problem Relation Age of Onset   Diabetes Maternal Uncle    Diabetes Maternal Grandfather    Cancer Paternal Grandfather    Asthma Neg Hx    Heart disease Neg Hx    Hypertension Neg Hx    Stroke Neg Hx     Allergies: No Known Allergies  Medications Prior to Admission  Medication Sig Dispense Refill Last Dose   aspirin EC 81 MG tablet Take 1 tablet (81 mg total) by mouth daily. Swallow whole. 30 tablet 12 Past Week   Prenatal Vit-Fe Fumarate-FA (PRENATAL PO) Take 1 tablet by mouth daily.   06/03/2022     Review of Systems   All systems reviewed and negative  except as stated in HPI  Blood pressure 130/89, pulse 75, temperature 98.1 F (36.7 C), temperature source Oral, resp. rate 18, height '5\' 3"'$  (1.6 m), weight 74.4 kg, last menstrual period 09/14/2021, currently breastfeeding. General appearance: alert, cooperative, and appears stated age Lungs: normal work of breathing  Heart: regular rate Abdomen: gravid Extremities: Homans sign is negative, no sign of DVT DTR's 2+ Presentation: cephalic Fetal monitoringBaseline: 145 bpm, Variability: Good {> 6 bpm), Accelerations: Reactive, and Decelerations: Absent Uterine activity: irregular Dilation: Fingertip Effacement (%): Thick Station: -3 Exam by:: victor c  Prenatal labs: ABO, Rh: --/--/PENDING (01/12 1833) Antibody: PENDING (01/12 1833) Rubella: 4.47 (08/28 1552) RPR: Non Reactive (11/13 1110)  HBsAg: Negative (08/28 1552)  HIV: Non Reactive (11/13 1110)  GBS: Positive/-- (01/04 1641)  1 hr  Glucola: normal (04/05/2022) Genetic screening: normal (01/18/2022) Anatomy US: normal (03/02/2022)  Prenatal Transfer Tool  Maternal Diabetes: No Genetic Screening: Normal Maternal Ultrasounds/Referrals: Normal Fetal Ultrasounds or other Referrals:  None Maternal Substance Abuse:  No Significant Maternal Medications:  None Significant Maternal Lab Results: Group B Strep positive  Results for orders placed or performed during the hospital encounter of 06/04/22 (from the past 24 hour(s))  Type and screen [place]   Collection Time: 06/04/22  6:33 PM  Result Value Ref Range   ABO/RH(D) PENDING    Antibody Screen PENDING    Sample Expiration      06/07/2022,2359 Performed at [facility], [place] [address], [place], [place] [address]     Patient Active Problem List   Diagnosis Date Noted   Transient hypertension of pregnancy in third trimester 06/04/2022   Midline scar of abdomen 05/27/2022   Fundal height low for dates in third trimester 05/27/2022   Supervision of other normal pregnancy, antepartum 01/06/2022   History of gestational hypertension 07/30/2020   History of salpingo-oophorectomy 01/21/2020   History of pyelonephritis 01/21/2020   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 G2P1001 at 34w4dhere for IOL for gHTN  #Labor: IOL, Cytotec 50/25. ARM when able. Patient declines FB #Pain: Epidural upon request #FWB: Cat 1 #ID:  GBS+ - on PCN #MOF: Breast feeding #MOC:Nexplanon  #Circ:   Boy  gHTN: 130/89 upon admission. Asymptomatic. CTM. Pre-eclampsia labs pending.   JConcepcion Living MD  06/04/2022, 7:11 PM

## 2022-06-04 NOTE — Progress Notes (Signed)
Labor Progress Note Tahja Liao is a 27 y.o. G2P1001 at 65w4dpresented for IOL for gHTN.  S:  Feeling some mild ctx.   O:  BP 130/77 (BP Location: Left Arm)   Pulse 68   Temp 97.9 F (36.6 C) (Oral)   Resp 17   Ht '5\' 3"'$  (1.6 m)   Wt 74.4 kg   LMP 09/14/2021 (Exact Date)   BMI 29.05 kg/m  EFM: baseline 135 bpm/ mod variability/ + accels/ no decels  Toco/IUPC: 2-3 SVE: deferred   A/P: 27y.o. G2P1001 374w4d1. Labor: latent 2. FWB: Cat I 3. Pain: analgesia/anesthesia/NO prn 4. gHTN: stable  S/p first dose of Cytotec. Recheck in 4 hrs. Anticipate SVD.  MeJulianne HandlerCNM 9:13 PM

## 2022-06-04 NOTE — Progress Notes (Signed)
    PRENATAL VISIT NOTE  Subjective:  Shannon Lyons is a 27 y.o. G2P1001 at 46w4dbeing seen today for ongoing prenatal care.  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  Patient reports  intermittent LUQ discomfort; she also says her BPs at home had diastolics in the 984T No s/s of pre-eclampsia .  Contractions: Irregular. Vag. Bleeding: None.  Movement: Present. Denies leaking of fluid.   The following portions of the patient's history were reviewed and updated as appropriate: allergies, current medications, past family history, past medical history, past social history, past surgical history and problem list.   Objective:   Vitals:   06/04/22 0837 06/04/22 0849  BP: (!) 139/97 (!) 135/91  Pulse: 63 64  Weight: 164 lb 6.4 oz (74.6 kg)     Fetal Status: Fetal Heart Rate (bpm): 132   Movement: Present     General:  Alert, oriented and cooperative. Patient is in no acute distress.  Skin: Skin is warm and dry. No rash noted.   Cardiovascular: Normal heart rate noted  Respiratory: Normal respiratory effort, no problems with respiration noted  Abdomen: Soft, gravid, appropriate for gestational age.  Pain/Pressure: Present     Pelvic: Cervical exam deferred        Extremities: Normal range of motion.  Edema: None  Mental Status: Normal mood and affect. Normal behavior. Normal judgment and thought content.   Assessment and Plan:  Pregnancy: G2P1001 at 319w4d. [redacted] weeks gestation of pregnancy GBS+  2. Gestational hypertension, third trimester Patient with a h/o GHTN. Diagnostic today, given patient had  elevated BPs a week ago. No s/s of pre-eclampsia. I told her that the recommendation is for delivery since she is 37wks, which she is amenable to. L&D charge called and pt may have to wait in MAU first. Pt to arrange childcare and then come in for IOL  1/5: 28%, 2719gm, ac 5336%afi 10, cephalic  Term labor symptoms and general obstetric precautions including but not limited to vaginal bleeding, contractions, leaking of fluid and fetal movement were reviewed in detail with the patient. Please refer to After Visit Summary for other counseling recommendations.   No follow-ups on file.  Future Appointments  Date Time Provider [place]1/18/2024  8:55 AM BaGriffin BasilMD WMCaguas [facility]ulatory [facility]MB[facility]1/25/2024  8:55 AM ArWoodroe ModeMD WMSouth [facility] HospitalMSouth[facility]  ChAletha HalimMD

## 2022-06-05 ENCOUNTER — Inpatient Hospital Stay (HOSPITAL_COMMUNITY): Payer: BC Managed Care – PPO | Admitting: Anesthesiology

## 2022-06-05 ENCOUNTER — Encounter (HOSPITAL_COMMUNITY): Payer: Self-pay | Admitting: Obstetrics and Gynecology

## 2022-06-05 DIAGNOSIS — O99824 Streptococcus B carrier state complicating childbirth: Secondary | ICD-10-CM

## 2022-06-05 DIAGNOSIS — O134 Gestational [pregnancy-induced] hypertension without significant proteinuria, complicating childbirth: Secondary | ICD-10-CM

## 2022-06-05 DIAGNOSIS — O139 Gestational [pregnancy-induced] hypertension without significant proteinuria, unspecified trimester: Secondary | ICD-10-CM | POA: Insufficient documentation

## 2022-06-05 DIAGNOSIS — Z3A37 37 weeks gestation of pregnancy: Secondary | ICD-10-CM

## 2022-06-05 LAB — CBC
HCT: 35.4 % — ABNORMAL LOW (ref 36.0–46.0)
Hemoglobin: 12.5 g/dL (ref 12.0–15.0)
MCH: 29.4 pg (ref 26.0–34.0)
MCHC: 35.3 g/dL (ref 30.0–36.0)
MCV: 83.3 fL (ref 80.0–100.0)
Platelets: 232 10*3/uL (ref 150–400)
RBC: 4.25 MIL/uL (ref 3.87–5.11)
RDW: 13.4 % (ref 11.5–15.5)
WBC: 9.2 10*3/uL (ref 4.0–10.5)
nRBC: 0 % (ref 0.0–0.2)

## 2022-06-05 LAB — PROTEIN / CREATININE RATIO, URINE
Creatinine, Urine: 66 mg/dL
Protein Creatinine Ratio: 0.11 mg/mg{Cre} (ref 0.00–0.15)
Total Protein, Urine: 7 mg/dL

## 2022-06-05 LAB — RPR: RPR Ser Ql: NONREACTIVE

## 2022-06-05 MED ORDER — FUROSEMIDE 20 MG PO TABS
20.0000 mg | ORAL_TABLET | Freq: Every day | ORAL | Status: DC
  Administered 2022-06-06: 20 mg via ORAL
  Filled 2022-06-05: qty 1

## 2022-06-05 MED ORDER — PRENATAL MULTIVITAMIN CH
1.0000 | ORAL_TABLET | Freq: Every day | ORAL | Status: DC
  Administered 2022-06-06: 1 via ORAL
  Filled 2022-06-05: qty 1

## 2022-06-05 MED ORDER — EPHEDRINE 5 MG/ML INJ: 10.0000 mg | INTRAVENOUS | Status: DC | PRN

## 2022-06-05 MED ORDER — PHENYLEPHRINE 80 MCG/ML (10ML) SYRINGE FOR IV PUSH (FOR BLOOD PRESSURE SUPPORT): 80.0000 ug | PREFILLED_SYRINGE | INTRAVENOUS | Status: DC | PRN

## 2022-06-05 MED ORDER — SIMETHICONE 80 MG PO CHEW: 80.0000 mg | CHEWABLE_TABLET | ORAL | Status: DC | PRN

## 2022-06-05 MED ORDER — MISOPROSTOL 50MCG HALF TABLET
50.0000 ug | ORAL_TABLET | Freq: Once | ORAL | Status: AC
  Administered 2022-06-05: 50 ug via ORAL
  Filled 2022-06-05: qty 1

## 2022-06-05 MED ORDER — DIBUCAINE (PERIANAL) 1 % EX OINT: 1.0000 | TOPICAL_OINTMENT | CUTANEOUS | Status: DC | PRN

## 2022-06-05 MED ORDER — WITCH HAZEL-GLYCERIN EX PADS: 1.0000 | MEDICATED_PAD | CUTANEOUS | Status: DC | PRN

## 2022-06-05 MED ORDER — LACTATED RINGERS IV SOLN
500.0000 mL | Freq: Once | INTRAVENOUS | Status: AC
  Administered 2022-06-05: 500 mL via INTRAVENOUS

## 2022-06-05 MED ORDER — BENZOCAINE-MENTHOL 20-0.5 % EX AERO
1.0000 | INHALATION_SPRAY | CUTANEOUS | Status: DC | PRN
  Administered 2022-06-05: 1 via TOPICAL
  Filled 2022-06-05: qty 56

## 2022-06-05 MED ORDER — ONDANSETRON HCL 4 MG/2ML IJ SOLN: 4.0000 mg | INTRAMUSCULAR | Status: DC | PRN

## 2022-06-05 MED ORDER — ZOLPIDEM TARTRATE 5 MG PO TABS: 5.0000 mg | ORAL_TABLET | Freq: Every evening | ORAL | Status: DC | PRN

## 2022-06-05 MED ORDER — LIDOCAINE HCL (PF) 1 % IJ SOLN
INTRAMUSCULAR | Status: DC | PRN
  Administered 2022-06-05: 2 mL via EPIDURAL
  Administered 2022-06-05: 10 mL via EPIDURAL

## 2022-06-05 MED ORDER — TERBUTALINE SULFATE 1 MG/ML IJ SOLN: 0.2500 mg | Freq: Once | INTRAMUSCULAR | Status: DC | PRN

## 2022-06-05 MED ORDER — FENTANYL CITRATE (PF) 100 MCG/2ML IJ SOLN
100.0000 ug | INTRAMUSCULAR | Status: DC | PRN
  Administered 2022-06-05: 100 ug via INTRAVENOUS
  Filled 2022-06-05: qty 2

## 2022-06-05 MED ORDER — DIPHENHYDRAMINE HCL 25 MG PO CAPS: 25.0000 mg | ORAL_CAPSULE | Freq: Four times a day (QID) | ORAL | Status: DC | PRN

## 2022-06-05 MED ORDER — IBUPROFEN 600 MG PO TABS
600.0000 mg | ORAL_TABLET | Freq: Four times a day (QID) | ORAL | Status: DC
  Administered 2022-06-05 – 2022-06-06 (×4): 600 mg via ORAL
  Filled 2022-06-05 (×4): qty 1

## 2022-06-05 MED ORDER — OXYTOCIN-SODIUM CHLORIDE 30-0.9 UT/500ML-% IV SOLN
1.0000 m[IU]/min | INTRAVENOUS | Status: DC
  Administered 2022-06-05 (×2): 2 m[IU]/min via INTRAVENOUS
  Filled 2022-06-05: qty 500

## 2022-06-05 MED ORDER — COCONUT OIL OIL: 1.0000 | TOPICAL_OIL | Status: DC | PRN

## 2022-06-05 MED ORDER — ACETAMINOPHEN 325 MG PO TABS: 650.0000 mg | ORAL_TABLET | ORAL | Status: DC | PRN

## 2022-06-05 MED ORDER — DIPHENHYDRAMINE HCL 50 MG/ML IJ SOLN: 12.5000 mg | INTRAMUSCULAR | Status: DC | PRN

## 2022-06-05 MED ORDER — TETANUS-DIPHTH-ACELL PERTUSSIS 5-2.5-18.5 LF-MCG/0.5 IM SUSY: 0.5000 mL | PREFILLED_SYRINGE | Freq: Once | INTRAMUSCULAR | Status: DC

## 2022-06-05 MED ORDER — ONDANSETRON HCL 4 MG PO TABS: 4.0000 mg | ORAL_TABLET | ORAL | Status: DC | PRN

## 2022-06-05 MED ORDER — FENTANYL-BUPIVACAINE-NACL 0.5-0.125-0.9 MG/250ML-% EP SOLN
EPIDURAL | Status: DC | PRN
  Administered 2022-06-05: 12 mL/h via EPIDURAL

## 2022-06-05 MED ORDER — FENTANYL-BUPIVACAINE-NACL 0.5-0.125-0.9 MG/250ML-% EP SOLN
12.0000 mL/h | EPIDURAL | Status: DC | PRN
  Filled 2022-06-05: qty 250

## 2022-06-05 MED ORDER — SENNOSIDES-DOCUSATE SODIUM 8.6-50 MG PO TABS
2.0000 | ORAL_TABLET | ORAL | Status: DC
  Administered 2022-06-05 – 2022-06-06 (×2): 2 via ORAL
  Filled 2022-06-05 (×2): qty 2

## 2022-06-05 NOTE — Progress Notes (Signed)
Patient ID: Shannon Lyons, female   DOB: 1996-02-16, 27 y.o.   MRN: [id number] Doing well, feeling more pain with contractions  Vitals:   06/05/22 0850 06/05/22 0900 06/05/22 0930 06/05/22 1000  BP: 128/83 134/79 128/78 119/64  Pulse: 74 78 68 63  Resp:      Temp:   98 F (36.7 C)   TempSrc:   Oral   SpO2:      Weight:      Height:       FHR reassuring UCs every 3-50mn  Dilation: 8 Effacement (%): 90 Cervical Position: Posterior Station: -1 Presentation: Vertex Exam by:: VChristy SartoriusCrecenzo  AROM at previous check, continuing to descend

## 2022-06-05 NOTE — Discharge Summary (Signed)
Postpartum Discharge Summary  Date of Service updated***     Patient Name: Shannon Lyons DOB: [birth year] MRN: [id number]  Date of admission: 06/04/2022 Delivery date:06/05/2022  Delivering provider: Concepcion Living  Date of discharge: 06/05/2022  Admitting diagnosis: Labor and delivery, indication for care [O75.9] Intrauterine pregnancy: [redacted]w[redacted]d    Secondary diagnosis:  Active Problems:   History of salpingo-oophorectomy   History of gestational hypertension   Supervision of other normal pregnancy, antepartum   Gestational hypertension   Vaginal delivery  Additional problems: ***    Discharge diagnosis: Term Pregnancy Delivered and Gestational Hypertension                                              Post partum procedures:{Postpartum procedures:23558} Augmentation: AROM, Pitocin, and Cytotec Complications: None  Hospital course: Induction of Labor With Vaginal Delivery   27y.o. yo G2P1001 at 29w5das admitted to the hospital 06/04/2022 for induction of labor.  Indication for induction: Gestational hypertension.  Patient had an uncomplicated labor course. Membrane Rupture Time/Date: 9:36 AM ,06/05/2022   Delivery Method:Vaginal, Spontaneous  Episiotomy: None  Lacerations:  None  Details of delivery can be found in separate delivery note.  Patient had a postpartum course complicated by***. Patient is discharged home 06/05/22.  Newborn Data: Birth date:06/05/2022  Birth time:12:38 PM  Gender:Female  Living status:Living  Apgars:9 ,9  Weight:   Magnesium Sulfate received: No BMZ received: No Rhophylac:N/A MMR:N/A T-DaP:Given prenatally Flu: No Transfusion:No  Physical exam  Vitals:   06/05/22 1000 06/05/22 1100 06/05/22 1137 06/05/22 1200  BP: 119/64 133/78  115/80  Pulse: 63 77  73  Resp:      Temp:   97.6 F (36.4 C)   TempSrc:   Oral   SpO2:      Weight:      Height:       General: {Exam; general:21111117} Lochia: {Desc;  appropriate/inappropriate:30686::"appropriate"} Uterine Fundus: {Desc; firm/soft:30687} Incision: {Exam; incision:21111123} DVT Evaluation: {Exam; dvt:2111122} Labs: Lab Results  Component Value Date   WBC 9.2 06/05/2022   HGB 12.5 06/05/2022   HCT 35.4 (L) 06/05/2022   MCV 83.3 06/05/2022   PLT 232 06/05/2022      Latest Ref Rng & Units 06/04/2022    6:33 PM  CMP  Glucose 70 - 99 mg/dL 95   BUN 6 - 20 mg/dL 9   Creatinine 0.44 - 1.00 mg/dL 0.73   Sodium 135 - 145 mmol/L 135   Potassium 3.5 - 5.1 mmol/L 3.9   Chloride 98 - 111 mmol/L 106   CO2 22 - 32 mmol/L 19   Calcium 8.9 - 10.3 mg/dL 9.5   Total Protein 6.5 - 8.1 g/dL 7.1   Total Bilirubin 0.3 - 1.2 mg/dL 0.2   Alkaline Phos 38 - 126 U/L 153   AST 15 - 41 U/L 27   ALT 0 - 44 U/L 15    Edinburgh Score:    09/04/2020    1:35 PM  Edinburgh Postnatal Depression Scale Screening Tool  I have been able to laugh and see the funny side of things. 0  I have looked forward with enjoyment to things. 0  I have blamed myself unnecessarily when things went wrong. 0  I have been anxious or worried for no good reason. 0  I have felt scared or panicky for no good  reason. 0  Things have been getting on top of me. 0  I have been so unhappy that I have had difficulty sleeping. 0  I have felt sad or miserable. 0  I have been so unhappy that I have been crying. 0  The thought of harming myself has occurred to me. 0  Edinburgh Postnatal Depression Scale Total 0     After visit meds:  Allergies as of 06/05/2022   No Known Allergies   Med Rec must be completed prior to using this [facility]***        Discharge home in stable condition Infant Feeding: {Baby feeding:23562} Infant Disposition:{CHL IP OB HOME WITH OILNZV:72820} Discharge instruction: per After Visit Summary and Postpartum booklet. Activity: Advance as tolerated. Pelvic rest for 6 weeks.  Diet: {OB UORV:61537943} Future Appointments:No future  appointments. Follow up Visit:   Please schedule this patient for a In person postpartum visit in 4 weeks with the following provider: MD. Additional Postpartum F/U:BP check 1 week  High risk pregnancy complicated by: HTN Delivery mode:  Vaginal, Spontaneous  Anticipated Birth Control:  {Birth Control:23956}   06/05/2022 Concepcion Living, MD

## 2022-06-05 NOTE — Progress Notes (Signed)
Patient ID: Shannon Lyons, female   DOB: [birth year], 27 y.o.   MRN: [id number] Doing well, feeling more pain with contractions  Vitals:   06/04/22 1839 06/04/22 1936 06/04/22 2224 06/05/22 0252  BP: 130/89 130/77 114/62 106/60  Pulse: 75 68 63 (!) 53  Resp: '18 17 16 16  '$ Temp: 98.1 F (36.7 C) 97.9 F (36.6 C) 98 F (36.7 C) 98 F (36.7 C)  TempSrc: Oral Oral Oral Oral  Weight: 74.4 kg     Height: '5\' 3"'$  (1.6 m)      FHR reassuring UCs every 3-61mn  Dilation: 2 Effacement (%): 70 Cervical Position: Posterior Station: -3 Presentation: Vertex Exam by:: MBenjaman Lobe CNM  Will start Pitocin

## 2022-06-05 NOTE — Plan of Care (Signed)

## 2022-06-05 NOTE — Progress Notes (Signed)
Patient ID: Shannon Lyons, female   DOB: 01-Dec-1995, 27 y.o.   MRN: [id number] Doing well Not feeling much pain with contractions   AVSS Vitals:   06/04/22 1839 06/04/22 1936 06/04/22 2224 06/05/22 0252  BP: 130/89 130/77 114/62 106/60  Pulse: 75 68 63 (!) 53  Resp: '18 17 16 16  '$ Temp: 98.1 F (36.7 C) 97.9 F (36.6 C) 98 F (36.7 C) 98 F (36.7 C)  TempSrc: Oral Oral Oral Oral  Weight: 74.4 kg     Height: '5\' 3"'$  (1.6 m)      FHR reactive UCs irregular  Last Cervix exam Dilation: Fingertip Effacement (%): Thick Cervical Position: Posterior Station: -3 Presentation: Vertex Exam by:: Florina Ou., RN

## 2022-06-05 NOTE — Anesthesia Procedure Notes (Signed)
Epidural Patient location during procedure: OB Start time: 06/05/2022 8:10 AM End time: 06/05/2022 8:17 AM  Staffing Anesthesiologist: Pervis Hocking, DO Performed: anesthesiologist   Preanesthetic Checklist Completed: patient identified, IV checked, risks and benefits discussed, monitors and equipment checked, pre-op evaluation and timeout performed  Epidural Patient position: sitting Prep: DuraPrep and site prepped and draped Patient monitoring: continuous pulse ox, blood pressure, heart rate and cardiac monitor Approach: midline Location: L3-L4 Injection technique: LOR air  Needle:  Needle type: Tuohy  Needle gauge: 17 G Needle length: 9 cm Needle insertion depth: 6 cm Catheter type: closed end flexible Catheter size: 19 Gauge Catheter at skin depth: 11 cm Test dose: negative  Assessment Sensory level: T8 Events: blood not aspirated, no cerebrospinal fluid, injection not painful, no injection resistance, no paresthesia and negative IV test  Additional Notes Patient identified. Risks/Benefits/Options discussed with patient including but not limited to bleeding, infection, nerve damage, paralysis, failed block, incomplete pain control, headache, blood pressure changes, nausea, vomiting, reactions to medication both or allergic, itching and postpartum back pain. Confirmed with bedside nurse the patient's most recent platelet count. Confirmed with patient that they are not currently taking any anticoagulation, have any bleeding history or any family history of bleeding disorders. Patient expressed understanding and wished to proceed. All questions were answered. Sterile technique was used throughout the entire procedure. Please see nursing notes for vital signs. Test dose was given through epidural catheter and negative prior to continuing to dose epidural or start infusion. Warning signs of high block given to the patient including shortness of breath, tingling/numbness in  hands, complete motor block, or any concerning symptoms with instructions to call for help. Patient was given instructions on fall risk and not to get out of bed. All questions and concerns addressed with instructions to call with any issues or inadequate analgesia.  Reason for block:procedure for pain

## 2022-06-05 NOTE — Lactation Note (Deleted)
This note was copied from a baby's chart. Lactation Consultation Note  Patient Name: Shannon Lyons [id number] Date: 06/05/2022 Reason for consult: Initial assessment;1st time breastfeeding;Early term 37-38.6wks (LGA greater thant 9 lbs, Birth Parent DM-1 see Birth Parent's - MR) Age:29 hours P1, Per Birth Parent,  this will be infant's 1st latch infant was given 5 mls of formula earlier. Birth Parent feeding preference is : Breast and supplementing infant with formula. Birth Parent pre-pumped breast with hand pump in DEBP Kit to pre-pump breast prior to latching infant. Birth Parent latched infant on her right breast using the football hold position, infant sustained latch and BF for 15 minutes see flow sheet for [employer]. Afterwards,  LC taught hand expression using a breast model and Birth Parent self expressed 3 mls of colostrum that was spoon fed to infant. [place] set Birth Parent up with DEBP, and Birth Parent was expressing colostrum as LC left the room. Birth Parent understands that EBM is safe for 4 hours at room temperature whereas formula must be used within 1 hour. LC discussed infant's input and output. Birth Parent  made aware of O/P services, breastfeeding support groups, community resources, and our phone # for post-discharge questions.   Birth Parent feeding plan: 1- Birth Parent will pre-pump breast with hand pump and continue to BF infant 8+ times within 24 hours, STS. 2- Afterwards  Birth Parent will  supplement infant with ant EBM 1st and then formula, this is Birth Parent's feeding choice, BF supplement sheet given. 3- Birth Parent will ask RN/LC for further latch assistance if needed.  4- Birth Parent will continue to use DEBP every 3 hours for 15 minutes on initial setting.  Maternal Data Has patient been taught Hand Expression?: Yes Does the patient have breastfeeding experience prior to this delivery?: No How long did the patient breastfeed?: Per Birth Parent, she BF her  1st child for one year, she is currently 36 years old.  Feeding Mother's Current Feeding Choice: Breast Milk and Formula  LATCH Score    Hold (Positioning): Assistance needed to correctly position infant at breast and maintain latch.      Lactation Tools Discussed/Used Tools: Pump Breast pump type: Double-Electric Breast Pump Pump Education: Setup, frequency, and cleaning;Milk Storage Reason for Pumping: Infant is ETI, Birth Parent with DM, LGA infant, Birth Parent will use hand pump in kit to pre-pump breast prior to latching infant at the breast due to having flat nipples. Pumping frequency: Birth Parent will use DEBP every 3 hours for 15 minutes on inital setting Pumped volume: 1 mL (Birth Parent was still expressing colostrum in breast flanges when LC left the room.)  Interventions Interventions: Breast feeding basics reviewed;Assisted with latch;Skin to skin;Position options;Support pillows;Adjust position;Breast massage;Expressed milk;Breast compression;Education;LC Services brochure  Discharge Pump: DEBP (Birth Parent ordered DEBP not arrived yet.)  Consult Status Consult Status: Follow-up Date: 06/06/22 Follow-up type: In-patient    Eulis Canner 06/05/2022, 8:56 PM

## 2022-06-05 NOTE — Lactation Note (Signed)
This note was copied from a baby's chart. Lactation Consultation Note  Patient Name: Shannon Lyons [id number] Date: 06/05/2022 Reason for consult: Initial assessment;Early term 37-38.6wks Age:29 hours,  Per Birth Parent, infant briefly BF at 1500 pm for 5 minutes and she made an attempt at 1700 pm but infant did not latch. Per Birth Parent, infant has been sleepy. Birth Parent had visitors at this time and did not latch infant. LC explained to do lots of STS and BF infant 8-12+ times within 24 hours, it's  time for feeding, if Birth Parent would like latch assistance to call [facility] written name on [employer] in room. LC dicussed hand expression and Per Birth Parent, she knows how to hand express. LC discussed continue to latch infant at the breast and if infant doesn't latch to hand express and give infant back EBM by spoon.  Birth Parent is experienced with BF she BF her two year old daughter for one year. Birth Parent would like hand pump before discharge.  Maternal Data Has patient been taught Hand Expression?: Yes Does the patient have breastfeeding experience prior to this delivery?: Yes How long did the patient breastfeed?: Per Birth Parent, she BF her 1st child for one year, she is currently 60 years old.  Feeding Mother's Current Feeding Choice: Breast Milk  LATCH Score                    Lactation Tools Discussed/Used    Interventions Interventions: Breast feeding basics reviewed;Skin to skin;Education;LC Services brochure;Hand express  Discharge    Consult Status Consult Status: Follow-up Date: 06/06/22 Follow-up type: In-patient    Eulis Canner 06/05/2022, 7:48 PM

## 2022-06-05 NOTE — Anesthesia Preprocedure Evaluation (Signed)
Anesthesia Evaluation  Patient identified by MRN, date of birth, ID band Patient awake    Reviewed: Allergy & Precautions, Patient's Chart, lab work & pertinent test results  History of Anesthesia Complications (+) PONV and history of anesthetic complications  Airway Mallampati: II  TM Distance: >3 FB Neck ROM: Full    Dental no notable dental hx.    Pulmonary neg pulmonary ROS   Pulmonary exam normal breath sounds clear to auscultation       Cardiovascular hypertension (PIH no meds), Normal cardiovascular exam Rhythm:Regular Rate:Normal     Neuro/Psych negative neurological ROS  negative psych ROS   GI/Hepatic negative GI ROS, Neg liver ROS,,,  Endo/Other  negative endocrine ROS    Renal/GU negative Renal ROS  negative genitourinary   Musculoskeletal negative musculoskeletal ROS (+)    Abdominal   Peds negative pediatric ROS (+)  Hematology negative hematology ROS (+) Hb 12.5, plt 232   Anesthesia Other Findings   Reproductive/Obstetrics (+) Pregnancy                             Anesthesia Physical Anesthesia Plan  ASA: 2  Anesthesia Plan: Epidural   Post-op Pain Management:    Induction:   PONV Risk Score and Plan: 2  Airway Management Planned: Natural Airway  Additional Equipment: None  Intra-op Plan:   Post-operative Plan:   Informed Consent: I have reviewed the patients History and Physical, chart, labs and discussed the procedure including the risks, benefits and alternatives for the proposed anesthesia with the patient or authorized representative who has indicated his/her understanding and acceptance.       Plan Discussed with:   Anesthesia Plan Comments:        Anesthesia Quick Evaluation

## 2022-06-06 MED ORDER — FUROSEMIDE 20 MG PO TABS: 20.0000 mg | ORAL_TABLET | Freq: Every day | ORAL | 0 refills | Status: AC

## 2022-06-06 MED ORDER — ACETAMINOPHEN 325 MG PO TABS: 650.0000 mg | ORAL_TABLET | ORAL | 0 refills | Status: AC | PRN

## 2022-06-06 MED ORDER — IBUPROFEN 600 MG PO TABS: 600.0000 mg | ORAL_TABLET | Freq: Four times a day (QID) | ORAL | 0 refills | Status: DC

## 2022-06-06 NOTE — Anesthesia Postprocedure Evaluation (Signed)
Anesthesia Post Note  Patient: Shannon Lyons  Procedure(s) Performed: AN AD HOC LABOR EPIDURAL     Patient location during evaluation: Mother Baby Anesthesia Type: Epidural Level of consciousness: awake and alert Pain management: pain level controlled Vital Signs Assessment: post-procedure vital signs reviewed and stable Respiratory status: spontaneous breathing, nonlabored ventilation and respiratory function stable Cardiovascular status: stable Postop Assessment: no headache, no backache, epidural receding, no apparent nausea or vomiting, patient able to bend at knees, able to ambulate and adequate PO intake Anesthetic complications: no   No notable events documented.  Last Vitals:  Vitals:   06/05/22 2352 06/06/22 0458  BP: 110/67 (!) 108/57  Pulse: (!) 58 (!) 53  Resp: 16 16  Temp: 36.8 C 36.6 C  SpO2: 99% 98%    Last Pain:  Vitals:   06/06/22 0458  TempSrc: Oral  PainSc:    Pain Goal:                   Jabier Mutton

## 2022-06-10 ENCOUNTER — Encounter: Payer: Self-pay | Admitting: Obstetrics and Gynecology

## 2022-06-14 ENCOUNTER — Telehealth (HOSPITAL_COMMUNITY): Payer: Self-pay

## 2022-06-14 NOTE — Telephone Encounter (Signed)
Patient reports feeling good. Patient declines questions/concerns about her health and healing.  Patient reports that baby is doing well. Eating, peeing/pooping, and gaining weight well. Baby sleeps in a bassinet. RN reviewed ABC's of safe sleep with patient. Patient declines any questions or concerns about baby.  EPDS score is 1.  Sharyn Lull [facility] 06/14/22,1852

## 2022-06-17 ENCOUNTER — Encounter: Payer: Self-pay | Admitting: Obstetrics & Gynecology

## 2022-07-21 ENCOUNTER — Encounter: Payer: Self-pay | Admitting: Obstetrics and Gynecology

## 2022-07-21 ENCOUNTER — Ambulatory Visit: Payer: BC Managed Care – PPO | Admitting: Obstetrics and Gynecology

## 2022-07-22 NOTE — Progress Notes (Signed)
Patient did not keep her postpartum appointment for 07/21/2022.  Durene Romans MD Attending Center for [employer] farm manager)

## 2022-09-03 ENCOUNTER — Other Ambulatory Visit: Payer: Self-pay | Admitting: Family Medicine
